# Patient Record
Sex: Female | Born: 1950
Health system: Southern US, Community
[De-identification: ages and names within clinical notes are randomized; demographics above are authoritative.]

## PROBLEM LIST (undated history)

## (undated) DIAGNOSIS — M1990 Unspecified osteoarthritis, unspecified site: Secondary | ICD-10-CM

## (undated) DIAGNOSIS — G4700 Insomnia, unspecified: Secondary | ICD-10-CM

## (undated) DIAGNOSIS — Z87442 Personal history of urinary calculi: Secondary | ICD-10-CM

## (undated) DIAGNOSIS — F329 Major depressive disorder, single episode, unspecified: Secondary | ICD-10-CM

## (undated) DIAGNOSIS — R42 Dizziness and giddiness: Secondary | ICD-10-CM

## (undated) DIAGNOSIS — R350 Frequency of micturition: Secondary | ICD-10-CM

## (undated) DIAGNOSIS — R091 Pleurisy: Secondary | ICD-10-CM

## (undated) DIAGNOSIS — Z973 Presence of spectacles and contact lenses: Secondary | ICD-10-CM

## (undated) DIAGNOSIS — N151 Renal and perinephric abscess: Secondary | ICD-10-CM

## (undated) DIAGNOSIS — F32A Depression, unspecified: Secondary | ICD-10-CM

## (undated) DIAGNOSIS — A419 Sepsis, unspecified organism: Secondary | ICD-10-CM

## (undated) HISTORY — PX: CATARACT EXTRACTION: SUR2

## (undated) HISTORY — DX: Depression, unspecified: F32.A

## (undated) HISTORY — DX: Renal and perinephric abscess: N15.1

## (undated) HISTORY — PX: CEREBRAL ANEURYSM REPAIR: SHX164

## (undated) HISTORY — DX: Insomnia, unspecified: G47.00

## (undated) HISTORY — DX: Dizziness and giddiness: R42

## (undated) HISTORY — PX: TUBAL LIGATION: SHX77

---
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
## 2010-01-19 DIAGNOSIS — N151 Renal and perinephric abscess: Secondary | ICD-10-CM

## 2010-01-19 HISTORY — DX: Renal and perinephric abscess: N15.1

## 2014-08-28 ENCOUNTER — Other Ambulatory Visit: Payer: Self-pay | Admitting: Nurse Practitioner

## 2014-08-28 DIAGNOSIS — Z1231 Encounter for screening mammogram for malignant neoplasm of breast: Secondary | ICD-10-CM

## 2014-09-11 ENCOUNTER — Ambulatory Visit: Payer: Self-pay

## 2015-09-27 ENCOUNTER — Other Ambulatory Visit: Payer: Self-pay | Admitting: Nurse Practitioner

## 2015-09-27 DIAGNOSIS — Z8249 Family history of ischemic heart disease and other diseases of the circulatory system: Secondary | ICD-10-CM

## 2015-09-27 DIAGNOSIS — Z8679 Personal history of other diseases of the circulatory system: Secondary | ICD-10-CM

## 2015-09-27 DIAGNOSIS — H538 Other visual disturbances: Secondary | ICD-10-CM

## 2016-08-10 ENCOUNTER — Encounter (INDEPENDENT_AMBULATORY_CARE_PROVIDER_SITE_OTHER): Payer: Self-pay | Admitting: Ophthalmology

## 2016-08-12 ENCOUNTER — Encounter (INDEPENDENT_AMBULATORY_CARE_PROVIDER_SITE_OTHER): Payer: BLUE CROSS/BLUE SHIELD | Admitting: Ophthalmology

## 2016-08-12 DIAGNOSIS — H35341 Macular cyst, hole, or pseudohole, right eye: Secondary | ICD-10-CM

## 2016-08-12 DIAGNOSIS — H35372 Puckering of macula, left eye: Secondary | ICD-10-CM | POA: Diagnosis not present

## 2016-08-12 DIAGNOSIS — H43813 Vitreous degeneration, bilateral: Secondary | ICD-10-CM | POA: Diagnosis not present

## 2016-12-31 DIAGNOSIS — F331 Major depressive disorder, recurrent, moderate: Secondary | ICD-10-CM | POA: Insufficient documentation

## 2018-04-15 DIAGNOSIS — R05 Cough: Secondary | ICD-10-CM | POA: Diagnosis not present

## 2018-04-15 DIAGNOSIS — R1111 Vomiting without nausea: Secondary | ICD-10-CM | POA: Diagnosis not present

## 2018-07-13 DIAGNOSIS — H11823 Conjunctivochalasis, bilateral: Secondary | ICD-10-CM | POA: Diagnosis not present

## 2018-07-13 DIAGNOSIS — H11153 Pinguecula, bilateral: Secondary | ICD-10-CM | POA: Diagnosis not present

## 2018-07-13 DIAGNOSIS — H02834 Dermatochalasis of left upper eyelid: Secondary | ICD-10-CM | POA: Diagnosis not present

## 2018-07-13 DIAGNOSIS — H40013 Open angle with borderline findings, low risk, bilateral: Secondary | ICD-10-CM | POA: Diagnosis not present

## 2018-07-13 DIAGNOSIS — H0102B Squamous blepharitis left eye, upper and lower eyelids: Secondary | ICD-10-CM | POA: Diagnosis not present

## 2018-07-13 DIAGNOSIS — H468 Other optic neuritis: Secondary | ICD-10-CM | POA: Diagnosis not present

## 2018-07-13 DIAGNOSIS — H18413 Arcus senilis, bilateral: Secondary | ICD-10-CM | POA: Diagnosis not present

## 2018-07-13 DIAGNOSIS — H0102A Squamous blepharitis right eye, upper and lower eyelids: Secondary | ICD-10-CM | POA: Diagnosis not present

## 2018-07-13 DIAGNOSIS — H02831 Dermatochalasis of right upper eyelid: Secondary | ICD-10-CM | POA: Diagnosis not present

## 2018-07-18 DIAGNOSIS — R9082 White matter disease, unspecified: Secondary | ICD-10-CM | POA: Diagnosis not present

## 2018-07-18 DIAGNOSIS — H469 Unspecified optic neuritis: Secondary | ICD-10-CM | POA: Diagnosis not present

## 2018-07-18 DIAGNOSIS — H18413 Arcus senilis, bilateral: Secondary | ICD-10-CM | POA: Diagnosis not present

## 2018-08-03 DIAGNOSIS — G4700 Insomnia, unspecified: Secondary | ICD-10-CM | POA: Diagnosis not present

## 2018-08-03 DIAGNOSIS — R42 Dizziness and giddiness: Secondary | ICD-10-CM | POA: Diagnosis not present

## 2018-08-03 DIAGNOSIS — R05 Cough: Secondary | ICD-10-CM | POA: Diagnosis not present

## 2018-08-03 DIAGNOSIS — F329 Major depressive disorder, single episode, unspecified: Secondary | ICD-10-CM | POA: Diagnosis not present

## 2018-08-15 ENCOUNTER — Telehealth: Payer: Self-pay | Admitting: Neurology

## 2018-08-15 ENCOUNTER — Ambulatory Visit (INDEPENDENT_AMBULATORY_CARE_PROVIDER_SITE_OTHER): Payer: Medicare HMO | Admitting: Neurology

## 2018-08-15 ENCOUNTER — Encounter: Payer: Self-pay | Admitting: Neurology

## 2018-08-15 ENCOUNTER — Other Ambulatory Visit: Payer: Self-pay

## 2018-08-15 VITALS — BP 150/78 | HR 60 | Ht 63.0 in | Wt 186.0 lb

## 2018-08-15 DIAGNOSIS — G4719 Other hypersomnia: Secondary | ICD-10-CM | POA: Diagnosis not present

## 2018-08-15 DIAGNOSIS — R0683 Snoring: Secondary | ICD-10-CM

## 2018-08-15 DIAGNOSIS — Z9889 Other specified postprocedural states: Secondary | ICD-10-CM | POA: Diagnosis not present

## 2018-08-15 DIAGNOSIS — R42 Dizziness and giddiness: Secondary | ICD-10-CM | POA: Diagnosis not present

## 2018-08-15 DIAGNOSIS — R351 Nocturia: Secondary | ICD-10-CM

## 2018-08-15 DIAGNOSIS — I671 Cerebral aneurysm, nonruptured: Secondary | ICD-10-CM | POA: Diagnosis not present

## 2018-08-15 DIAGNOSIS — H81399 Other peripheral vertigo, unspecified ear: Secondary | ICD-10-CM | POA: Diagnosis not present

## 2018-08-15 NOTE — Patient Instructions (Signed)
Unfortunately, dizziness and vertigo are common complaints but often not due to a primary neurological reason or single underlying medical problem. Often, there a combination of factors, that result in dizziness. This includes blood pressure fluctuations, medication side effects, blood sugar fluctuations, stress, vertigo episodes, poor sleep with sleep deprivation, dehydration, and electrolyte disturbance or other metabolic and endocrinological reasons, meaning hormone related problems such as thyroid dysfunction. Your recent brain MRI was stable. We will request a MRA (angiogram). I will refer you to physical therapy for Vestibular rehab.  We will also pursue a sleep study to rule out obstructive sleep apnea.

## 2018-08-15 NOTE — Telephone Encounter (Signed)
Mcarthur Rossetti Josem Kaufmann: [id number] (exp. 08/15/18 to 09/14/18) faxed order to [facility] they will reach out to the patient to schedule. Their phone number [phone] & fax # [phone]

## 2018-08-15 NOTE — Progress Notes (Signed)
Subjective:    Patient ID: Karla Watson is a 68 y.o. female.  HPI     Huston FoleySaima Lekendrick Alpern, MD, PhD [facility] [address], [address] [address] [place], [place] [address]  Dear Dr. Kateri PlummerMorrow,   I saw your patient, Raeanne GathersLori Scronce, upon your kind request in my neurologic clinic today for initial consultation of her recurrent vertigo.  The patient is unaccompanied today.  As you know, Ms. Doug Souoensing is a 68 year old right-handed woman with an underlying medical history of depression, history of kidney abscess, history of cerebral aneurysm with status post coiling in 2015, status post cataract surgery, depression, insomnia, and obesity, who reports recurrent vertigo spells for the past several months.  They are stable.  Symptoms occur with rapid changes in body position or head turns.  She has not fallen thankfully.  She had similar symptoms but to a stronger degree however when she was diagnosed with her brain aneurysm.  She worries a little bit about the brain aneurysm.  She had coiling some 15 years ago.  She has had symptoms when standing up from the seated position or while walking.  She declined orthostatic blood pressure checks today stating that the blood pressure cuff hurts when it is done repeatedly.  She was given exercises for vertigo and found them not particularly helpful.  She has not been sleeping well.  This is a chronic and ongoing problem, she snores, she has nocturia about 2 or 3 times per average night and has had the occasional morning headaches in the past month and a half.  She had recurrent ear infections as a child.  Her Epworth sleepiness score is 11 out of 24.  She drinks caffeine in the form of coffee, about 2 cups/day, soda 1 to 2/day and occasional tea.  She drinks alcohol occasionally.  She lives with her husband. I reviewed your office note from 08/03/2018.  Of note, she is on psychotropic medications including trazodone 100 mg at bedtime and fluoxetine 40 mg  daily, she takes bupropion long-acting 300 mg daily as well.  She reported intermittent vertigo spells over the past 3 weeks at the time.  She is followed by [facility] and had recent MRIs.  She had a brain MRI and orbital MRI with and without contrast on 07/18/2018 through [facility] and I reviewed the results: IMPRESSION: No acute findings or mass lesion. Suspected mild decreased size/atrophy of the left optic nerve. Please correlate with clinical exam findings. No findings of acute optic neuritis or other inflammatory/infectious process. No compressive pathology  identified of the optic nerves or optic chiasm. IMPRESSION: 1. No acute intracranial process. Stable brain MRI compared to 10/08/2015. Mild white matter disease, nonspecific but commonly seen with chronic small vessel ischemic change particularly in patients of this age. No significant progression. No enhancing white matter lesion.  2. Contemporaneous orbital MRI is reported separately. As I understand. She had a brain MRI with and without contrast through [facility] on 10/09/2015 and I reviewed the results:  IMPRESSION:  Mild chronic microvascular ischemic change. Otherwise normal MRI of the brain. No acute abnormality.  Her Past Medical History Is Significant For: Past Medical History:  Diagnosis Date  . Depression   . Insomnia   . Kidney abscess   . Vertigo     Her Past Surgical History Is Significant For: Past Surgical History:  Procedure Laterality Date  . CATARACT EXTRACTION    . CEREBRAL ANEURYSM REPAIR    . TUBAL LIGATION  Her Family History Is Significant For: Family History  Problem Relation Age of Onset  . Breast cancer Mother   . Prostate cancer Father     Her Social History Is Significant For: Social History   Socioeconomic History  . Marital status: Married    Spouse name: Not on file  . Number of children: Not on file  . Years of education: Not on file  . Highest education level: Not on  file  Occupational History  . Not on file  Social Needs  . Financial resource strain: Not on file  . Food insecurity    Worry: Not on file    Inability: Not on file  . Transportation needs    Medical: Not on file    Non-medical: Not on file  Tobacco Use  . Smoking status: Never Smoker  . Smokeless tobacco: Never Used  Substance and Sexual Activity  . Alcohol use: Not on file  . Drug use: Not on file  . Sexual activity: Not on file  Lifestyle  . Physical activity    Days per week: Not on file    Minutes per session: Not on file  . Stress: Not on file  Relationships  . Social Herbalist on phone: Not on file    Gets together: Not on file    Attends religious service: Not on file    Active member of club or organization: Not on file    Attends meetings of clubs or organizations: Not on file    Relationship status: Not on file  Other Topics Concern  . Not on file  Social History Narrative  . Not on file    Her Allergies Are:  Allergies  Allergen Reactions  . Erythromycin   :   Her Current Medications Are:  Outpatient Encounter Medications as of 08/15/2018  Medication Sig  . buPROPion (WELLBUTRIN XL) 300 MG 24 hr tablet Take 300 mg by mouth daily.  Marland Kitchen FLUoxetine (PROZAC) 40 MG capsule Take 40 mg by mouth daily.  . traZODone (DESYREL) 100 MG tablet Take 100 mg by mouth at bedtime.   No facility-administered encounter medications on file as of 08/15/2018.   :   Review of Systems:  Out of a complete 14 point review of systems, all are reviewed and negative with the exception of these symptoms as listed below:  Review of Systems  Neurological:       Pt presents today to discuss her vertigo. She has had persistent stable vertigo for 6 months. She has declined orthostatic blood pressures.  Epworth Sleepiness Scale 0= would never doze 1= slight chance of dozing 2= moderate chance of dozing 3= high chance of dozing  Sitting and reading: 1 Watching TV:  2 Sitting inactive in a public place (ex. Theater or meeting): 1 As a passenger in a car for an hour without a break: 3 Lying down to rest in the afternoon: 3 Sitting and talking to someone: 0 Sitting quietly after lunch (no alcohol): 1 In a car, while stopped in traffic: 0 Total: 11     Objective:  Neurological Exam  Physical Exam Physical Examination:   Vitals:   08/15/18 1133  BP: (!) 150/78  Pulse: 60    General Examination: The patient is a very pleasant 68 y.o. female in no acute distress. She appears well-developed and well-nourished and well groomed. Declined orthostatics.  HEENT: Normocephalic, atraumatic, pupils are equal, round and reactive to light and accommodation. Corrective eyeglasses in place,  hearing is slightly impaired appearing.  Tympanic membranes are clear bilaterally.  Face is symmetric.  No vertigo spells with changes in her head or body position currently.  She has no hypophonia, no lip, neck or jaw tremor, neck is supple, no carotid bruits, airway examination reveals a longer uvula, small airway, small tonsils, neck circumference of 14-7/8 inches. Tongue protrudes centrally and palate elevates symmetrically.   Chest: Clear to auscultation without wheezing, rhonchi or crackles noted.  Heart: S1+S2+0, regular and normal without murmurs, rubs or gallops noted.   Abdomen: Soft, non-tender and non-distended with normal bowel sounds appreciated on auscultation.  Extremities: There is no pitting edema in the distal lower extremities bilaterally. Pedal pulses are intact.  Skin: Warm and dry without trophic changes noted. There are no significant varicose veins.  Musculoskeletal: exam reveals no obvious joint deformities, tenderness or joint swelling or erythema.   Neurologically:  Mental status: The patient is awake, alert and oriented in all 4 spheres. Her immediate and remote memory, attention, language skills and fund of knowledge are appropriate. There  is no evidence of aphasia, agnosia, apraxia or anomia. Speech is clear with normal prosody and enunciation. Thought process is linear. Mood is normal and affect is normal.  Cranial nerves II - XII are as described above under HEENT exam. In addition: shoulder shrug is normal with equal shoulder height noted. Motor exam: Normal bulk, strength and tone is noted. There is no drift, tremor or rebound. Romberg is not tested, she does not feel Comfortable standing with her eyes closed, reflexes are 2+ throughout. Babinski: Toes are flexor bilaterally. Fine motor skills and coordination: intact with normal finger taps, normal hand movements, normal rapid alternating patting, normal foot taps and normal foot agility.  Cerebellar testing: No dysmetria or intention tremor on finger to nose testing. Heel to shin is unremarkable bilaterally. There is no truncal or gait ataxia.  Sensory exam: intact to light touch, vibration, temperature sense in the upper and lower extremities.  Gait, station and balance: She stands easily. No veering to one side is noted. No leaning to one side is noted. Posture is age-appropriate and stance is narrow based. Gait shows normal stride length and normal pace. No problems turning are noted. Tandem walk is slow and cautious, but doable.   Assessment and Plan:   In summary, Karla EvensLaurie Bumgarner is a very pleasant 68 y.o.-year old female with an underlying medical history of depression, history of kidney abscess, history of cerebral aneurysm with status post coiling in 2015, status post cataract surgery, depression, insomnia, and obesity, who Presents for evaluation of her recurrent vertiginous spells.  Her history and examination are somewhat concerning for episodic vertigo.  She has a history of aneurysm coiling 15 years ago.  She worries about recurrence of symptoms.  She is advised that her recent brain MRI was stable.  We can proceed with an MRA head for more reassurance.  She has not had  any physical therapy for vertigo.  I suggested we make a referral to PT.  She is agreeable.  Furthermore, I suggested we proceed with a sleep study to rule out Underlying obstructive sleep apnea.  Sleep deprivation can certainly cause dizziness and make vertigo worse, we have seen that connection from time to time.  She does report snoring and not sleeping well, nocturia as well as recent morning headaches.  She may benefit from evaluating her blood pressure numbers with orthostatic vital signs, she declined testing these today in our office.  She is encouraged to talk to about this when she follows up with you.  For now, we will proceed with a referral to neuro rehab for vestibular therapy through physical therapy.  We will proceed with a sleep study and a MRA head without contrast, we will request for the MR angiogram to be done at the same location she had her most recent MRIs.  We will keep her posted as to her test results and follow-up after testing.  I answered all her questions today and she was in agreement  Thank you very much for allowing me to participate in the care of Ms. Toesing. If I can be of any further assistance to you please do not hesitate to call me at [phone].  Sincerely,   Huston FoleySaima Tranice Laduke, MD, PhD

## 2018-08-16 ENCOUNTER — Telehealth: Payer: Self-pay

## 2018-08-16 DIAGNOSIS — R0683 Snoring: Secondary | ICD-10-CM

## 2018-08-16 NOTE — Telephone Encounter (Signed)
[employer] denied in lab sleep study. Does not meet criteria. Need HST order

## 2018-08-16 NOTE — Telephone Encounter (Signed)
VO for HST from Dr. Athar received. HST order placed.  

## 2018-08-24 ENCOUNTER — Ambulatory Visit
Admission: RE | Admit: 2018-08-24 | Discharge: 2018-08-24 | Disposition: A | Discharge status: Home / Self Care | Payer: Medicare HMO | Source: Ambulatory Visit | Attending: Family Medicine | Admitting: Family Medicine

## 2018-08-24 ENCOUNTER — Other Ambulatory Visit: Payer: Self-pay | Admitting: Family Medicine

## 2018-08-24 DIAGNOSIS — R05 Cough: Secondary | ICD-10-CM

## 2018-08-24 DIAGNOSIS — R059 Cough, unspecified: Secondary | ICD-10-CM

## 2018-09-05 ENCOUNTER — Ambulatory Visit: Payer: Medicare HMO | Attending: Neurology | Admitting: Physical Therapy

## 2018-09-05 ENCOUNTER — Telehealth: Payer: Self-pay

## 2018-09-05 NOTE — Telephone Encounter (Signed)
We have attempted to call the patient two times to schedule sleep study.  Patient has been unavailable at the phone numbers we have on file and has not returned our calls. If patient calls back we will schedule them for their sleep study.  

## 2018-12-20 DIAGNOSIS — R091 Pleurisy: Secondary | ICD-10-CM

## 2018-12-20 HISTORY — DX: Pleurisy: R09.1

## 2018-12-22 ENCOUNTER — Other Ambulatory Visit: Payer: Self-pay

## 2018-12-22 ENCOUNTER — Emergency Department (HOSPITAL_COMMUNITY): Payer: Medicare HMO

## 2018-12-22 ENCOUNTER — Encounter (HOSPITAL_COMMUNITY): Payer: Self-pay | Admitting: Emergency Medicine

## 2018-12-22 ENCOUNTER — Emergency Department (HOSPITAL_COMMUNITY)
Admission: EM | Admit: 2018-12-22 | Discharge: 2018-12-22 | Disposition: A | Discharge status: Home / Self Care | Payer: Medicare HMO | Attending: Emergency Medicine | Admitting: Emergency Medicine

## 2018-12-22 DIAGNOSIS — Z79899 Other long term (current) drug therapy: Secondary | ICD-10-CM | POA: Insufficient documentation

## 2018-12-22 DIAGNOSIS — R0602 Shortness of breath: Secondary | ICD-10-CM | POA: Diagnosis present

## 2018-12-22 DIAGNOSIS — N10 Acute tubulo-interstitial nephritis: Secondary | ICD-10-CM | POA: Insufficient documentation

## 2018-12-22 DIAGNOSIS — R091 Pleurisy: Secondary | ICD-10-CM | POA: Diagnosis not present

## 2018-12-22 DIAGNOSIS — R1110 Vomiting, unspecified: Secondary | ICD-10-CM | POA: Insufficient documentation

## 2018-12-22 DIAGNOSIS — R079 Chest pain, unspecified: Secondary | ICD-10-CM | POA: Diagnosis not present

## 2018-12-22 DIAGNOSIS — R0789 Other chest pain: Secondary | ICD-10-CM | POA: Diagnosis not present

## 2018-12-22 HISTORY — DX: Pleurisy: R09.1

## 2018-12-22 LAB — BASIC METABOLIC PANEL
Anion gap: 9 (ref 5–15)
BUN: 10 mg/dL (ref 8–23)
CO2: 27 mmol/L (ref 22–32)
Calcium: 9.6 mg/dL (ref 8.9–10.3)
Chloride: 102 mmol/L (ref 98–111)
Creatinine, Ser: 0.98 mg/dL (ref 0.44–1.00)
GFR calc Af Amer: >60 mL/min (ref >60)
GFR calc non Af Amer: >60 mL/min (ref >60)
Glucose, Bld: 84 mg/dL (ref 70–99)
Potassium: 4.1 mmol/L (ref 3.5–5.1)
Sodium: 138 mmol/L (ref 135–145)

## 2018-12-22 LAB — CBC
HCT: 44.5 % (ref 36.0–46.0)
Hemoglobin: 14.8 g/dL (ref 12.0–15.0)
MCH: 28.6 pg (ref 26.0–34.0)
MCHC: 33.3 g/dL (ref 30.0–36.0)
MCV: 86.1 fL (ref 80.0–100.0)
Platelets: 232 10*3/uL (ref 150–400)
RBC: 5.17 MIL/uL — ABNORMAL HIGH (ref 3.87–5.11)
RDW: 12 % (ref 11.5–15.5)
WBC: 6.2 10*3/uL (ref 4.0–10.5)
nRBC: 0 % (ref 0.0–0.2)

## 2018-12-22 LAB — LIPASE, BLOOD: Lipase: 52 U/L — ABNORMAL HIGH (ref 11–51)

## 2018-12-22 LAB — URINALYSIS, ROUTINE W REFLEX MICROSCOPIC
Bilirubin Urine: NEGATIVE
Glucose, UA: NEGATIVE mg/dL
Hgb urine dipstick: NEGATIVE
Ketones, ur: 5 mg/dL — AB
Nitrite: POSITIVE — AB
Protein, ur: 30 mg/dL — AB
Specific Gravity, Urine: 1.021 (ref 1.005–1.030)
WBC, UA: >50 WBC/hpf — ABNORMAL HIGH (ref 0–5)
pH: 5 (ref 5.0–8.0)

## 2018-12-22 LAB — HEPATIC FUNCTION PANEL
ALT: 19 U/L (ref 0–44)
AST: 19 U/L (ref 15–41)
Albumin: 4 g/dL (ref 3.5–5.0)
Alkaline Phosphatase: 58 U/L (ref 38–126)
Bilirubin, Direct: 0.1 mg/dL (ref 0.0–0.2)
Indirect Bilirubin: 0.7 mg/dL (ref 0.3–0.9)
Total Bilirubin: 0.8 mg/dL (ref 0.3–1.2)
Total Protein: 6.9 g/dL (ref 6.5–8.1)

## 2018-12-22 LAB — TROPONIN I (HIGH SENSITIVITY)
Troponin I (High Sensitivity): 5 ng/L (ref <18)
Troponin I (High Sensitivity): 5 ng/L (ref <18)

## 2018-12-22 LAB — D-DIMER, QUANTITATIVE: D-Dimer, Quant: 0.48 ug/mL-FEU (ref 0.00–0.50)

## 2018-12-22 MED ORDER — SODIUM CHLORIDE 0.9 % IV SOLN
1.0000 g | Freq: Once | INTRAVENOUS | Status: AC
  Administered 2018-12-22: 18:00:00 1 g via INTRAVENOUS
  Filled 2018-12-22: qty 10

## 2018-12-22 MED ORDER — ONDANSETRON HCL 4 MG/2ML IJ SOLN
4.0000 mg | Freq: Once | INTRAMUSCULAR | Status: AC
  Administered 2018-12-22: 16:00:00 4 mg via INTRAVENOUS
  Filled 2018-12-22: qty 2

## 2018-12-22 MED ORDER — SODIUM CHLORIDE 0.9% FLUSH: 3.0000 mL | Freq: Once | INTRAVENOUS | Status: DC

## 2018-12-22 MED ORDER — KETOROLAC TROMETHAMINE 15 MG/ML IJ SOLN
15.0000 mg | Freq: Once | INTRAMUSCULAR | Status: AC
  Administered 2018-12-22: 15 mg via INTRAVENOUS
  Filled 2018-12-22: qty 1

## 2018-12-22 MED ORDER — HYDROCODONE-ACETAMINOPHEN 5-325 MG PO TABS: 1.0000 | ORAL_TABLET | Freq: Four times a day (QID) | ORAL | 0 refills | Status: DC | PRN

## 2018-12-22 MED ORDER — MORPHINE SULFATE (PF) 4 MG/ML IV SOLN
4.0000 mg | Freq: Once | INTRAVENOUS | Status: AC
  Administered 2018-12-22: 16:00:00 4 mg via INTRAVENOUS
  Filled 2018-12-22: qty 1

## 2018-12-22 MED ORDER — LIDOCAINE 5 % EX PTCH: 1.0000 | MEDICATED_PATCH | CUTANEOUS | 0 refills | Status: DC

## 2018-12-22 MED ORDER — CEPHALEXIN 500 MG PO CAPS: 500.0000 mg | ORAL_CAPSULE | Freq: Four times a day (QID) | ORAL | 0 refills | Status: DC

## 2018-12-22 MED ORDER — ONDANSETRON 4 MG PO TBDP: 4.0000 mg | ORAL_TABLET | Freq: Three times a day (TID) | ORAL | 0 refills | Status: DC | PRN

## 2018-12-22 NOTE — ED Notes (Signed)
ED Provider at bedside. 

## 2018-12-22 NOTE — ED Triage Notes (Signed)
C/o L sided back pain with SOB, vomiting, and dizziness since yesterday.  States it feels like her L lower lung and she believes it is pleurisy because it feels the same as the last time she had it.

## 2018-12-22 NOTE — ED Provider Notes (Signed)
[facility] EMERGENCY DEPARTMENT Provider Note   CSN: [id number] Arrival date & time: 12/22/18  1356     History   Chief Complaint Chief Complaint  Patient presents with   back pain   Emesis   Shortness of Breath    HPI Karla Watson is a 68 y.o. female.     Patient is a 68 year old female with a history of depression, pleurisy, vertigo and prior kidney abscess who is presenting today with 2-day history of worsening left side pain.  She describes it as a sharp stabbing severe pain that is worse with taking a deep breath, coughing or any type of movement.  It started spontaneously without any known exacerbating factor.  She denies any falls, jerking or twisting motions prior to the event.  She has not had cough and does not have specific shortness of breath but states with any type of deep breathing she catches her breath because the pain is so severe.  In the last 2 days she has had multiple episodes of emesis which she thinks is related to the pain.  She has no localized abdominal pain, urinary complaints or change in bowel movements.  She denies fever, congestion, sore throat.  No known Covid contacts.  No family history of blood clots or heart disease.  She herself has never had heart problems or clots.  She has had no recent travel and denies any unilateral leg pain or swelling.  No smoking history or lung disease.  The history is provided by the patient.  Emesis Shortness of Breath Associated symptoms: vomiting     Past Medical History:  Diagnosis Date   Depression    Insomnia    Kidney abscess    Pleurisy    Vertigo     There are no active problems to display for this patient.   Past Surgical History:  Procedure Laterality Date   CATARACT EXTRACTION     CEREBRAL ANEURYSM REPAIR     TUBAL LIGATION       OB History   No obstetric history on file.      Home Medications    Prior to Admission medications   Medication Sig Start  Date End Date Taking? Authorizing Provider  buPROPion (WELLBUTRIN XL) 300 MG 24 hr tablet Take 300 mg by mouth daily.    [provider]  FLUoxetine (PROZAC) 40 MG capsule Take 40 mg by mouth daily.    [provider]  traZODone (DESYREL) 100 MG tablet Take 100 mg by mouth at bedtime.    [provider]    Family History Family History  Problem Relation Age of Onset   Breast cancer Mother    Prostate cancer Father     Social History Social History   Tobacco Use   Smoking status: Never Smoker   Smokeless tobacco: Never Used  Substance Use Topics   Alcohol use: Not Currently   Drug use: Never     Allergies   Erythromycin   Review of Systems Review of Systems  Respiratory: Positive for shortness of breath.   Gastrointestinal: Positive for vomiting.  All other systems reviewed and are negative.    Physical Exam Updated Vital Signs BP (!) 168/72 (BP Location: Right Arm)    Pulse 61    Temp 98.8 F (37.1 C) (Oral)    Resp 20    SpO2 97%   Physical Exam Vitals signs and nursing note reviewed.  Constitutional:      General: She is  not in acute distress.    Appearance: She is well-developed. She is obese.  HENT:     Head: Normocephalic and atraumatic.     Mouth/Throat:     Mouth: Mucous membranes are moist.  Eyes:     Pupils: Pupils are equal, round, and reactive to light.  Cardiovascular:     Rate and Rhythm: Normal rate and regular rhythm.     Heart sounds: Normal heart sounds. No murmur. No friction rub.  Pulmonary:     Effort: Pulmonary effort is normal. Tachypnea present.     Breath sounds: Normal breath sounds. No wheezing or rales.  Chest:     Chest wall: Tenderness present.    Abdominal:     General: Bowel sounds are normal. There is no distension.     Palpations: Abdomen is soft.     Tenderness: There is no abdominal tenderness. There is no right CVA tenderness, left CVA tenderness, guarding or rebound.    Musculoskeletal: Normal range of motion.        General: No tenderness.     Right lower leg: No edema.     Left lower leg: No edema.     Comments: No edema  Skin:    General: Skin is warm and dry.     Capillary Refill: Capillary refill takes less than 2 seconds.     Findings: Rash present.       Neurological:     General: No focal deficit present.     Mental Status: She is alert and oriented to person, place, and time. Mental status is at baseline.     Cranial Nerves: No cranial nerve deficit.  Psychiatric:        Mood and Affect: Mood normal.        Behavior: Behavior normal.        Thought Content: Thought content normal.      ED Treatments / Results  Labs (all labs ordered are listed, but only abnormal results are displayed) Labs Reviewed  CBC - Abnormal; Notable for the following components:      Result Value   RBC 5.17 (*)    All other components within normal limits  URINALYSIS, ROUTINE W REFLEX MICROSCOPIC - Abnormal; Notable for the following components:   APPearance CLOUDY (*)    Ketones, ur 5 (*)    Protein, ur 30 (*)    Nitrite POSITIVE (*)    Leukocytes,Ua LARGE (*)    WBC, UA >50 (*)    Bacteria, UA MANY (*)    Non Squamous Epithelial 0-5 (*)    All other components within normal limits  LIPASE, BLOOD - Abnormal; Notable for the following components:   Lipase 52 (*)    All other components within normal limits  URINE CULTURE  BASIC METABOLIC PANEL  D-DIMER, QUANTITATIVE (NOT AT [facility])  HEPATIC FUNCTION PANEL  TROPONIN I (HIGH SENSITIVITY)  TROPONIN I (HIGH SENSITIVITY)    EKG EKG Interpretation  Date/Time:  Thursday December 22 2018 14:46:41 EST Ventricular Rate:  62 PR Interval:  108 QRS Duration: 80 QT Interval:  408 QTC Calculation: 414 R Axis:   62 Text Interpretation: Sinus rhythm with short PR Otherwise normal ECG No previous tracing Confirmed by Gwyneth SproutPlunkett, Johnelle Tafolla ([id number]) on 12/22/2018 3:22:12 PM   Radiology Dg Chest 2  View  Result Date: 12/22/2018 CLINICAL DATA:  Left-sided posterior chest pain since yesterday. Pleuritic component. EXAM: CHEST - 2 VIEW COMPARISON:  08/24/2018 FINDINGS: Lungs are adequately inflated and otherwise clear.  Cardiomediastinal silhouette and remainder the exam is unchanged. IMPRESSION: No active cardiopulmonary disease. Electronically Signed   By: Marin Olp M.D.   On: 12/22/2018 15:25    Procedures Procedures (including critical care time)  Medications Ordered in ED Medications  sodium chloride flush (NS) 0.9 % injection 3 mL (has no administration in time range)  morphine 4 MG/ML injection 4 mg (has no administration in time range)  ondansetron (ZOFRAN) injection 4 mg (has no administration in time range)     Initial Impression / Assessment and Plan / ED Course  I have reviewed the triage vital signs and the nursing notes.  Pertinent labs & imaging results that were available during my care of the patient were reviewed by me and considered in my medical decision making (see chart for details).        68y/o female presenting with atypical chest pain today which is more pleuritic in nature and reproducible with palpation and movement.  There is no sign of shingles today.  Patient denies trauma.  Patient does have a history of pleurisy.  Lung sounds are clear bilaterally and x-ray without acute findings.  Patient does not have symptoms consistent with Covid.  She is a low risk Wells for age but has no unilateral leg pain or swelling or other risk factors.  Patient given pain control.  She has no abdominal pain today concerning for pancreatitis, hepatitis, diverticulitis or appendicitis.  Lower suspicion for pyelonephritis.  However patient has had a kidney abscess in the past so we will check a UA Patient is satting 97% on room air and low suspicion for ACS, pericarditis, myocarditis or dissection.  EKG without acute findings.  4:35 PM Labs are all fairly unremarkable.   Troponin after 2 days of constant pain is 5 and low suspicion for ACS.  D-dimer is within normal limits.  LFTs, renal function lipase all within normal limits.  Patient's pain is improved after morphine but will also give Toradol as there is no contraindication.  We will send patient home with supportive care and follow-up with PCP in approximately 1 week if symptoms persist.  Patient given strict return precautions.  5:26 PM Patient is feeling better after Toradol.  Urine did come back and appears infected with greater than 50 white cells, many bacteria and positive leukocytes and nitrites.  Patient does have a history of incontinence and does have to wear a pad regularly but denies any dysuria.  However with the vomiting the side pain will cover for possible early pyelonephritis.  Patient received 1 dose of Rocephin here and sent home with Keflex.  She is comfortable with the plan of care.  Final Clinical Impressions(s) / ED Diagnoses   Final diagnoses:  Pleurisy without effusion  Left-sided chest wall pain  Acute pyelonephritis    ED Discharge Orders         Ordered    cephALEXin (KEFLEX) 500 MG capsule  4 times daily     12/22/18 1730    ondansetron (ZOFRAN ODT) 4 MG disintegrating tablet  Every 8 hours PRN     12/22/18 1730    lidocaine (LIDODERM) 5 %  Every 24 hours     12/22/18 1730    HYDROcodone-acetaminophen (NORCO/VICODIN) 5-325 MG tablet  Every 6 hours PRN     12/22/18 1730           Blanchie Dessert, MD 12/22/18 1731

## 2018-12-22 NOTE — Discharge Instructions (Addendum)
Today your x-ray and blood tests were all normal.  No signs of heart attack, pneumonia or blood clot.  This is most likely pleurisy however  your urine was infected and we are treating you  for possible kidney infection with antibiotics. If you start running fever, become more short of breath or you are not able to hold anything down please return to the emergency room.  Use the lidocaine patches and take aleve for pain.  If the pain is not controlled with those medications you can also take the hydrocodone.  Also take the medications as needed for nausea.

## 2018-12-25 LAB — URINE CULTURE: Culture: >=100000 — AB

## 2018-12-26 ENCOUNTER — Telehealth: Payer: Self-pay

## 2018-12-26 DIAGNOSIS — R109 Unspecified abdominal pain: Secondary | ICD-10-CM | POA: Diagnosis not present

## 2018-12-26 DIAGNOSIS — R0781 Pleurodynia: Secondary | ICD-10-CM | POA: Diagnosis not present

## 2018-12-26 DIAGNOSIS — R748 Abnormal levels of other serum enzymes: Secondary | ICD-10-CM | POA: Diagnosis not present

## 2018-12-26 NOTE — Telephone Encounter (Signed)
Post ED Visit - Positive Culture Follow-up  Culture report reviewed by antimicrobial stewardship pharmacist: [place] Team []  Elenor Quinones, Pharm.D. []  Heide Guile, Pharm.D., BCPS AQ-ID []  Parks Neptune, Pharm.D., BCPS []  Alycia Rossetti, Pharm.D., BCPS []  [place], Pharm.D., BCPS, AAHIVP []  Legrand Como, Pharm.D., BCPS, AAHIVP []  Salome Arnt, PharmD, BCPS []  Johnnette Gourd, PharmD, BCPS []  Hughes Better, PharmD, BCPS []  Leeroy Cha, PharmD []  Laqueta Linden, PharmD, BCPS []  Albertina Parr, [place] Team []  Leodis Sias, PharmD []  Lindell Spar, PharmD []  Royetta Asal, PharmD []  Graylin Shiver, Rph []  Rema Fendt) Glennon Mac, PharmD []  Arlyn Dunning, PharmD []  Netta Cedars, PharmD []  Dia Sitter, PharmD []  Leone Haven, PharmD []  Gretta Arab, PharmD []  Theodis Shove, PharmD []  Peggyann Juba, PharmD []  Reuel Boom, PharmD   Positive urine culture Treated with Cephalexin, organism sensitive to the same and no further patient follow-up is required at this time.  Genia Del 12/26/2018, 11:05 AM

## 2019-01-05 DIAGNOSIS — R109 Unspecified abdominal pain: Secondary | ICD-10-CM | POA: Diagnosis not present

## 2019-01-05 DIAGNOSIS — R748 Abnormal levels of other serum enzymes: Secondary | ICD-10-CM | POA: Diagnosis not present

## 2019-02-23 ENCOUNTER — Other Ambulatory Visit: Payer: Self-pay | Admitting: Gastroenterology

## 2019-02-23 DIAGNOSIS — R748 Abnormal levels of other serum enzymes: Secondary | ICD-10-CM | POA: Diagnosis not present

## 2019-02-23 DIAGNOSIS — R12 Heartburn: Secondary | ICD-10-CM | POA: Diagnosis not present

## 2019-02-23 DIAGNOSIS — R1013 Epigastric pain: Secondary | ICD-10-CM

## 2019-03-05 ENCOUNTER — Ambulatory Visit: Payer: Medicare HMO | Attending: Internal Medicine

## 2019-03-05 DIAGNOSIS — Z23 Encounter for immunization: Secondary | ICD-10-CM

## 2019-03-05 NOTE — Progress Notes (Signed)
   Covid-19 Vaccination Clinic  Name:  Savonna Birchmeier    MRN: [id number] DOB: [birth year]  03/05/2019  Ms. Karczewski was observed post Covid-19 immunization for 15 minutes without incidence. She was provided with Vaccine Information Sheet and instruction to access the V-Safe system.   Ms. Hinnenkamp was instructed to call 911 with any severe reactions post vaccine: Marland Kitchen Difficulty breathing  . Swelling of your face and throat  . A fast heartbeat  . A bad rash all over your body  . Dizziness and weakness    Immunizations Administered    Name Date Dose VIS Date Route   Pfizer COVID-19 Vaccine 03/05/2019 11:15 AM 0.3 mL 12/30/2018 Intramuscular   Manufacturer: [employer], [employer]   Lot: [id number]   NDC: [address]-7

## 2019-03-06 ENCOUNTER — Ambulatory Visit
Admission: RE | Admit: 2019-03-06 | Discharge: 2019-03-06 | Disposition: A | Discharge status: Home / Self Care | Payer: Medicare HMO | Source: Ambulatory Visit | Attending: Gastroenterology | Admitting: Gastroenterology

## 2019-03-06 DIAGNOSIS — K7689 Other specified diseases of liver: Secondary | ICD-10-CM | POA: Diagnosis not present

## 2019-03-06 DIAGNOSIS — R748 Abnormal levels of other serum enzymes: Secondary | ICD-10-CM

## 2019-03-06 DIAGNOSIS — R1013 Epigastric pain: Secondary | ICD-10-CM

## 2019-03-14 ENCOUNTER — Other Ambulatory Visit: Payer: Self-pay | Admitting: Gastroenterology

## 2019-03-14 DIAGNOSIS — N289 Disorder of kidney and ureter, unspecified: Secondary | ICD-10-CM

## 2019-03-28 ENCOUNTER — Ambulatory Visit: Payer: Medicare HMO | Attending: Internal Medicine

## 2019-03-28 DIAGNOSIS — Z23 Encounter for immunization: Secondary | ICD-10-CM | POA: Insufficient documentation

## 2019-03-28 NOTE — Progress Notes (Signed)
   Covid-19 Vaccination Clinic  Name:  Karla Watson    MRN: [id number] DOB: 03/27/1950  03/28/2019  Karla Watson was observed post Covid-19 immunization for 15 minutes without incident. She was provided with Vaccine Information Sheet and instruction to access the V-Safe system.   Karla Watson was instructed to call 911 with any severe reactions post vaccine: Marland Kitchen Difficulty breathing  . Swelling of face and throat  . A fast heartbeat  . A bad rash all over body  . Dizziness and weakness   Immunizations Administered    Name Date Dose VIS Date Route   Pfizer COVID-19 Vaccine 03/28/2019  1:36 PM 0.3 mL 12/30/2018 Intramuscular   Manufacturer: [employer], [employer]   Lot: [id number]   NDC: [address]-3

## 2019-04-19 DIAGNOSIS — K3189 Other diseases of stomach and duodenum: Secondary | ICD-10-CM | POA: Diagnosis not present

## 2019-04-19 DIAGNOSIS — N289 Disorder of kidney and ureter, unspecified: Secondary | ICD-10-CM | POA: Diagnosis not present

## 2019-04-19 DIAGNOSIS — K760 Fatty (change of) liver, not elsewhere classified: Secondary | ICD-10-CM | POA: Diagnosis not present

## 2019-04-19 DIAGNOSIS — N281 Cyst of kidney, acquired: Secondary | ICD-10-CM | POA: Diagnosis not present

## 2019-04-19 DIAGNOSIS — K449 Diaphragmatic hernia without obstruction or gangrene: Secondary | ICD-10-CM | POA: Diagnosis not present

## 2019-09-26 ENCOUNTER — Other Ambulatory Visit: Payer: Self-pay | Admitting: Family Medicine

## 2019-09-26 DIAGNOSIS — Z1231 Encounter for screening mammogram for malignant neoplasm of breast: Secondary | ICD-10-CM

## 2019-10-17 ENCOUNTER — Ambulatory Visit: Payer: Medicare HMO

## 2020-03-05 DIAGNOSIS — R5383 Other fatigue: Secondary | ICD-10-CM | POA: Diagnosis not present

## 2020-03-05 DIAGNOSIS — R1110 Vomiting, unspecified: Secondary | ICD-10-CM | POA: Diagnosis not present

## 2020-03-06 DIAGNOSIS — R1110 Vomiting, unspecified: Secondary | ICD-10-CM | POA: Diagnosis not present

## 2020-03-06 DIAGNOSIS — R5383 Other fatigue: Secondary | ICD-10-CM | POA: Diagnosis not present

## 2020-03-07 ENCOUNTER — Inpatient Hospital Stay (HOSPITAL_COMMUNITY)
Admission: EM | Admit: 2020-03-07 | Discharge: 2020-03-09 | DRG: 854 | Disposition: A | Discharge status: Home / Self Care | Payer: Medicare HMO | Attending: Family Medicine | Admitting: Family Medicine

## 2020-03-07 ENCOUNTER — Emergency Department (HOSPITAL_COMMUNITY): Payer: Medicare HMO

## 2020-03-07 ENCOUNTER — Inpatient Hospital Stay (HOSPITAL_COMMUNITY): Payer: Medicare HMO | Admitting: Certified Registered Nurse Anesthetist

## 2020-03-07 ENCOUNTER — Other Ambulatory Visit: Payer: Self-pay

## 2020-03-07 ENCOUNTER — Inpatient Hospital Stay (HOSPITAL_COMMUNITY): Payer: Medicare HMO

## 2020-03-07 ENCOUNTER — Encounter (HOSPITAL_COMMUNITY): Payer: Self-pay

## 2020-03-07 ENCOUNTER — Encounter (HOSPITAL_COMMUNITY)
Admission: EM | Disposition: A | Discharge status: Home / Self Care | Payer: Self-pay | Source: Home / Self Care | Attending: Family Medicine

## 2020-03-07 DIAGNOSIS — N200 Calculus of kidney: Secondary | ICD-10-CM

## 2020-03-07 DIAGNOSIS — R109 Unspecified abdominal pain: Secondary | ICD-10-CM | POA: Diagnosis not present

## 2020-03-07 DIAGNOSIS — Z6832 Body mass index (BMI) 32.0-32.9, adult: Secondary | ICD-10-CM

## 2020-03-07 DIAGNOSIS — Z881 Allergy status to other antibiotic agents status: Secondary | ICD-10-CM | POA: Diagnosis not present

## 2020-03-07 DIAGNOSIS — R319 Hematuria, unspecified: Secondary | ICD-10-CM | POA: Diagnosis not present

## 2020-03-07 DIAGNOSIS — G4700 Insomnia, unspecified: Secondary | ICD-10-CM | POA: Diagnosis not present

## 2020-03-07 DIAGNOSIS — K219 Gastro-esophageal reflux disease without esophagitis: Secondary | ICD-10-CM | POA: Diagnosis not present

## 2020-03-07 DIAGNOSIS — Z713 Dietary counseling and surveillance: Secondary | ICD-10-CM

## 2020-03-07 DIAGNOSIS — Z20822 Contact with and (suspected) exposure to covid-19: Secondary | ICD-10-CM | POA: Diagnosis not present

## 2020-03-07 DIAGNOSIS — Z1623 Resistance to quinolones and fluoroquinolones: Secondary | ICD-10-CM | POA: Diagnosis not present

## 2020-03-07 DIAGNOSIS — Z79891 Long term (current) use of opiate analgesic: Secondary | ICD-10-CM | POA: Diagnosis not present

## 2020-03-07 DIAGNOSIS — A419 Sepsis, unspecified organism: Secondary | ICD-10-CM | POA: Diagnosis not present

## 2020-03-07 DIAGNOSIS — F418 Other specified anxiety disorders: Secondary | ICD-10-CM | POA: Diagnosis not present

## 2020-03-07 DIAGNOSIS — N135 Crossing vessel and stricture of ureter without hydronephrosis: Secondary | ICD-10-CM | POA: Diagnosis not present

## 2020-03-07 DIAGNOSIS — N390 Urinary tract infection, site not specified: Secondary | ICD-10-CM

## 2020-03-07 DIAGNOSIS — E6609 Other obesity due to excess calories: Secondary | ICD-10-CM | POA: Diagnosis present

## 2020-03-07 DIAGNOSIS — G8929 Other chronic pain: Secondary | ICD-10-CM | POA: Diagnosis present

## 2020-03-07 DIAGNOSIS — Z419 Encounter for procedure for purposes other than remedying health state, unspecified: Secondary | ICD-10-CM

## 2020-03-07 DIAGNOSIS — M79605 Pain in left leg: Secondary | ICD-10-CM | POA: Diagnosis present

## 2020-03-07 DIAGNOSIS — N132 Hydronephrosis with renal and ureteral calculous obstruction: Secondary | ICD-10-CM | POA: Diagnosis not present

## 2020-03-07 DIAGNOSIS — K449 Diaphragmatic hernia without obstruction or gangrene: Secondary | ICD-10-CM | POA: Diagnosis not present

## 2020-03-07 DIAGNOSIS — B9620 Unspecified Escherichia coli [E. coli] as the cause of diseases classified elsewhere: Secondary | ICD-10-CM | POA: Diagnosis present

## 2020-03-07 DIAGNOSIS — N136 Pyonephrosis: Secondary | ICD-10-CM | POA: Diagnosis not present

## 2020-03-07 DIAGNOSIS — R110 Nausea: Secondary | ICD-10-CM | POA: Diagnosis not present

## 2020-03-07 DIAGNOSIS — Z79899 Other long term (current) drug therapy: Secondary | ICD-10-CM | POA: Diagnosis not present

## 2020-03-07 DIAGNOSIS — Z66 Do not resuscitate: Secondary | ICD-10-CM | POA: Diagnosis present

## 2020-03-07 DIAGNOSIS — N281 Cyst of kidney, acquired: Secondary | ICD-10-CM | POA: Diagnosis present

## 2020-03-07 DIAGNOSIS — R1032 Left lower quadrant pain: Secondary | ICD-10-CM | POA: Diagnosis not present

## 2020-03-07 DIAGNOSIS — Z9689 Presence of other specified functional implants: Secondary | ICD-10-CM | POA: Diagnosis not present

## 2020-03-07 DIAGNOSIS — R231 Pallor: Secondary | ICD-10-CM | POA: Diagnosis not present

## 2020-03-07 DIAGNOSIS — K5750 Diverticulosis of both small and large intestine without perforation or abscess without bleeding: Secondary | ICD-10-CM | POA: Diagnosis not present

## 2020-03-07 DIAGNOSIS — F32A Depression, unspecified: Secondary | ICD-10-CM | POA: Diagnosis present

## 2020-03-07 DIAGNOSIS — R0902 Hypoxemia: Secondary | ICD-10-CM | POA: Diagnosis not present

## 2020-03-07 DIAGNOSIS — I10 Essential (primary) hypertension: Secondary | ICD-10-CM | POA: Diagnosis not present

## 2020-03-07 HISTORY — PX: CYSTOSCOPY W/ URETERAL STENT PLACEMENT: SHX1429

## 2020-03-07 LAB — BASIC METABOLIC PANEL
Anion gap: 9 (ref 5–15)
BUN: 14 mg/dL (ref 8–23)
CO2: 25 mmol/L (ref 22–32)
Calcium: 9.2 mg/dL (ref 8.9–10.3)
Chloride: 106 mmol/L (ref 98–111)
Creatinine, Ser: 1.13 mg/dL — ABNORMAL HIGH (ref 0.44–1.00)
GFR, Estimated: 53 mL/min — ABNORMAL LOW (ref >60)
Glucose, Bld: 154 mg/dL — ABNORMAL HIGH (ref 70–99)
Potassium: 3.9 mmol/L (ref 3.5–5.1)
Sodium: 140 mmol/L (ref 135–145)

## 2020-03-07 LAB — URINALYSIS, ROUTINE W REFLEX MICROSCOPIC
Bilirubin Urine: NEGATIVE
Glucose, UA: NEGATIVE mg/dL
Ketones, ur: 15 mg/dL — AB
Nitrite: POSITIVE — AB
Protein, ur: 30 mg/dL — AB
Specific Gravity, Urine: >1.03 — ABNORMAL HIGH (ref 1.005–1.030)
pH: 5.5 (ref 5.0–8.0)

## 2020-03-07 LAB — CBC WITH DIFFERENTIAL/PLATELET
Abs Immature Granulocytes: 0.04 10*3/uL (ref 0.00–0.07)
Basophils Absolute: 0 10*3/uL (ref 0.0–0.1)
Basophils Relative: 0 %
Eosinophils Absolute: 0 10*3/uL (ref 0.0–0.5)
Eosinophils Relative: 0 %
HCT: 45 % (ref 36.0–46.0)
Hemoglobin: 14.8 g/dL (ref 12.0–15.0)
Immature Granulocytes: 0 %
Lymphocytes Relative: 2 %
Lymphs Abs: 0.3 10*3/uL — ABNORMAL LOW (ref 0.7–4.0)
MCH: 27.9 pg (ref 26.0–34.0)
MCHC: 32.9 g/dL (ref 30.0–36.0)
MCV: 84.9 fL (ref 80.0–100.0)
Monocytes Absolute: 0.8 10*3/uL (ref 0.1–1.0)
Monocytes Relative: 6 %
Neutro Abs: 11.2 10*3/uL — ABNORMAL HIGH (ref 1.7–7.7)
Neutrophils Relative %: 92 %
Platelets: 178 10*3/uL (ref 150–400)
RBC: 5.3 MIL/uL — ABNORMAL HIGH (ref 3.87–5.11)
RDW: 12.2 % (ref 11.5–15.5)
WBC: 12.3 10*3/uL — ABNORMAL HIGH (ref 4.0–10.5)
nRBC: 0 % (ref 0.0–0.2)

## 2020-03-07 LAB — HIV ANTIBODY (ROUTINE TESTING W REFLEX): HIV Screen 4th Generation wRfx: NONREACTIVE

## 2020-03-07 LAB — LACTIC ACID, PLASMA
Lactic Acid, Venous: 1.3 mmol/L (ref 0.5–1.9)
Lactic Acid, Venous: 2.3 mmol/L (ref 0.5–1.9)

## 2020-03-07 LAB — PROCALCITONIN: Procalcitonin: 17.51 ng/mL

## 2020-03-07 LAB — RESP PANEL BY RT-PCR (FLU A&B, COVID) ARPGX2
Influenza A by PCR: NEGATIVE
Influenza B by PCR: NEGATIVE
SARS Coronavirus 2 by RT PCR: NEGATIVE

## 2020-03-07 LAB — URINALYSIS, MICROSCOPIC (REFLEX)
RBC / HPF: >50 RBC/hpf (ref 0–5)
WBC, UA: >50 WBC/hpf (ref 0–5)

## 2020-03-07 SURGERY — CYSTOSCOPY, WITH RETROGRADE PYELOGRAM AND URETERAL STENT INSERTION
Anesthesia: General | Site: Ureter | Laterality: Left

## 2020-03-07 MED ORDER — ACETAMINOPHEN 650 MG RE SUPP: 650.0000 mg | Freq: Four times a day (QID) | RECTAL | Status: DC | PRN

## 2020-03-07 MED ORDER — CHLORHEXIDINE GLUCONATE 0.12 % MT SOLN
OROMUCOSAL | Status: AC
  Administered 2020-03-07: 15 mL via OROMUCOSAL
  Filled 2020-03-07: qty 15

## 2020-03-07 MED ORDER — HYDROMORPHONE HCL 1 MG/ML IJ SOLN
0.5000 mg | Freq: Once | INTRAMUSCULAR | Status: AC
  Administered 2020-03-07: 0.5 mg via INTRAVENOUS
  Filled 2020-03-07: qty 1

## 2020-03-07 MED ORDER — ORAL CARE MOUTH RINSE: 15.0000 mL | Freq: Once | OROMUCOSAL | Status: AC

## 2020-03-07 MED ORDER — LACTATED RINGERS IV BOLUS (SEPSIS): 1000.0000 mL | Freq: Once | INTRAVENOUS | Status: DC

## 2020-03-07 MED ORDER — LACTATED RINGERS IV SOLN: INTRAVENOUS | Status: DC

## 2020-03-07 MED ORDER — FLUOXETINE HCL 20 MG PO CAPS
40.0000 mg | ORAL_CAPSULE | Freq: Every day | ORAL | Status: DC
  Administered 2020-03-08 – 2020-03-09 (×2): 40 mg via ORAL
  Filled 2020-03-07 (×3): qty 2

## 2020-03-07 MED ORDER — SODIUM CHLORIDE 0.9 % IV SOLN
1.0000 g | Freq: Once | INTRAVENOUS | Status: AC
  Administered 2020-03-07: 1 g via INTRAVENOUS
  Filled 2020-03-07: qty 10

## 2020-03-07 MED ORDER — PHENYLEPHRINE HCL-NACL 10-0.9 MG/250ML-% IV SOLN
INTRAVENOUS | Status: DC | PRN
  Administered 2020-03-07: 80 ug/min via INTRAVENOUS

## 2020-03-07 MED ORDER — ONDANSETRON HCL 4 MG/2ML IJ SOLN: 4.0000 mg | Freq: Four times a day (QID) | INTRAMUSCULAR | Status: DC | PRN

## 2020-03-07 MED ORDER — PROPOFOL 10 MG/ML IV BOLUS
INTRAVENOUS | Status: DC | PRN
  Administered 2020-03-07: 150 mg via INTRAVENOUS

## 2020-03-07 MED ORDER — PROPOFOL 10 MG/ML IV BOLUS
INTRAVENOUS | Status: AC
  Filled 2020-03-07: qty 20

## 2020-03-07 MED ORDER — PHENYLEPHRINE HCL (PRESSORS) 10 MG/ML IV SOLN
INTRAVENOUS | Status: DC | PRN
  Administered 2020-03-07: 80 ug via INTRAVENOUS

## 2020-03-07 MED ORDER — IOHEXOL 300 MG/ML  SOLN
INTRAMUSCULAR | Status: DC | PRN
  Administered 2020-03-07: 3 mL via URETHRAL

## 2020-03-07 MED ORDER — MORPHINE SULFATE (PF) 2 MG/ML IV SOLN: 2.0000 mg | INTRAVENOUS | Status: DC | PRN

## 2020-03-07 MED ORDER — SODIUM CHLORIDE 0.9 % IR SOLN
Status: DC | PRN
  Administered 2020-03-07: 1000 mL
  Administered 2020-03-07: 3000 mL

## 2020-03-07 MED ORDER — LACTATED RINGERS IV BOLUS (SEPSIS)
1000.0000 mL | Freq: Once | INTRAVENOUS | Status: AC
  Administered 2020-03-07: 1000 mL via INTRAVENOUS

## 2020-03-07 MED ORDER — PROMETHAZINE HCL 25 MG/ML IJ SOLN: 6.2500 mg | INTRAMUSCULAR | Status: DC | PRN

## 2020-03-07 MED ORDER — ACETAMINOPHEN 325 MG PO TABS
650.0000 mg | ORAL_TABLET | Freq: Four times a day (QID) | ORAL | Status: DC | PRN
  Administered 2020-03-08: 650 mg via ORAL
  Filled 2020-03-07 (×2): qty 2

## 2020-03-07 MED ORDER — FENTANYL CITRATE (PF) 100 MCG/2ML IJ SOLN
INTRAMUSCULAR | Status: DC | PRN
  Administered 2020-03-07: 100 ug via INTRAVENOUS

## 2020-03-07 MED ORDER — CHLORHEXIDINE GLUCONATE 0.12 % MT SOLN: 15.0000 mL | Freq: Once | OROMUCOSAL | Status: AC

## 2020-03-07 MED ORDER — KETOROLAC TROMETHAMINE 30 MG/ML IJ SOLN
30.0000 mg | Freq: Once | INTRAMUSCULAR | Status: AC
  Administered 2020-03-07: 30 mg via INTRAVENOUS
  Filled 2020-03-07: qty 1

## 2020-03-07 MED ORDER — ACETAMINOPHEN 10 MG/ML IV SOLN
INTRAVENOUS | Status: DC | PRN
  Administered 2020-03-07: 1000 mg via INTRAVENOUS

## 2020-03-07 MED ORDER — HYDROMORPHONE HCL 1 MG/ML IJ SOLN
1.0000 mg | Freq: Once | INTRAMUSCULAR | Status: AC
  Administered 2020-03-07: 1 mg via INTRAVENOUS
  Filled 2020-03-07: qty 1

## 2020-03-07 MED ORDER — ACETAMINOPHEN 10 MG/ML IV SOLN
INTRAVENOUS | Status: AC
  Filled 2020-03-07: qty 100

## 2020-03-07 MED ORDER — CHLORHEXIDINE GLUCONATE CLOTH 2 % EX PADS
6.0000 | MEDICATED_PAD | Freq: Every day | CUTANEOUS | Status: DC
  Administered 2020-03-07 – 2020-03-08 (×2): 6 via TOPICAL

## 2020-03-07 MED ORDER — TRAZODONE HCL 50 MG PO TABS
100.0000 mg | ORAL_TABLET | Freq: Every day | ORAL | Status: DC
  Administered 2020-03-07 – 2020-03-08 (×2): 100 mg via ORAL
  Filled 2020-03-07 (×2): qty 2

## 2020-03-07 MED ORDER — SODIUM CHLORIDE 0.9% FLUSH
3.0000 mL | Freq: Two times a day (BID) | INTRAVENOUS | Status: DC
  Administered 2020-03-08: 3 mL via INTRAVENOUS

## 2020-03-07 MED ORDER — DEXAMETHASONE SODIUM PHOSPHATE 4 MG/ML IJ SOLN
INTRAMUSCULAR | Status: DC | PRN
  Administered 2020-03-07: 4 mg via INTRAVENOUS

## 2020-03-07 MED ORDER — ENOXAPARIN SODIUM 40 MG/0.4ML ~~LOC~~ SOLN
40.0000 mg | SUBCUTANEOUS | Status: DC
  Administered 2020-03-08 – 2020-03-09 (×2): 40 mg via SUBCUTANEOUS
  Filled 2020-03-07 (×2): qty 0.4

## 2020-03-07 MED ORDER — ONDANSETRON HCL 4 MG PO TABS: 4.0000 mg | ORAL_TABLET | Freq: Four times a day (QID) | ORAL | Status: DC | PRN

## 2020-03-07 MED ORDER — ONDANSETRON HCL 4 MG/2ML IJ SOLN
INTRAMUSCULAR | Status: AC
  Filled 2020-03-07: qty 2

## 2020-03-07 MED ORDER — ONDANSETRON HCL 4 MG/2ML IJ SOLN
4.0000 mg | Freq: Once | INTRAMUSCULAR | Status: AC
  Administered 2020-03-07: 4 mg via INTRAVENOUS
  Filled 2020-03-07: qty 2

## 2020-03-07 MED ORDER — LIDOCAINE HCL (CARDIAC) PF 100 MG/5ML IV SOSY
PREFILLED_SYRINGE | INTRAVENOUS | Status: DC | PRN
  Administered 2020-03-07: 80 mg via INTRAVENOUS

## 2020-03-07 MED ORDER — SUCCINYLCHOLINE CHLORIDE 20 MG/ML IJ SOLN
INTRAMUSCULAR | Status: DC | PRN
  Administered 2020-03-07: 100 mg via INTRAVENOUS

## 2020-03-07 MED ORDER — BUPROPION HCL ER (XL) 150 MG PO TB24
300.0000 mg | ORAL_TABLET | Freq: Every day | ORAL | Status: DC
  Administered 2020-03-08 – 2020-03-09 (×2): 300 mg via ORAL
  Filled 2020-03-07 (×2): qty 2

## 2020-03-07 MED ORDER — ONDANSETRON HCL 4 MG/2ML IJ SOLN
INTRAMUSCULAR | Status: DC | PRN
  Administered 2020-03-07: 4 mg via INTRAVENOUS

## 2020-03-07 MED ORDER — SODIUM CHLORIDE 0.9 % IV SOLN
1.0000 g | INTRAVENOUS | Status: DC
  Administered 2020-03-08 – 2020-03-09 (×2): 1 g via INTRAVENOUS
  Filled 2020-03-07: qty 10
  Filled 2020-03-07: qty 1

## 2020-03-07 MED ORDER — FENTANYL CITRATE (PF) 100 MCG/2ML IJ SOLN: 25.0000 ug | INTRAMUSCULAR | Status: DC | PRN

## 2020-03-07 MED ORDER — DEXAMETHASONE SODIUM PHOSPHATE 10 MG/ML IJ SOLN
INTRAMUSCULAR | Status: AC
  Filled 2020-03-07: qty 1

## 2020-03-07 MED ORDER — FENTANYL CITRATE (PF) 250 MCG/5ML IJ SOLN
INTRAMUSCULAR | Status: AC
  Filled 2020-03-07: qty 5

## 2020-03-07 MED ORDER — MORPHINE SULFATE (PF) 4 MG/ML IV SOLN
4.0000 mg | INTRAVENOUS | Status: DC | PRN
  Administered 2020-03-08: 4 mg via INTRAVENOUS
  Filled 2020-03-07: qty 1

## 2020-03-07 MED ORDER — EPHEDRINE SULFATE 50 MG/ML IJ SOLN
INTRAMUSCULAR | Status: DC | PRN
  Administered 2020-03-07: 15 mg via INTRAVENOUS
  Administered 2020-03-07: 10 mg via INTRAVENOUS

## 2020-03-07 MED ORDER — LIDOCAINE 2% (20 MG/ML) 5 ML SYRINGE
INTRAMUSCULAR | Status: AC
  Filled 2020-03-07: qty 5

## 2020-03-07 SURGICAL SUPPLY — 18 items
BAG URINE DRAIN 2000ML AR STRL (UROLOGICAL SUPPLIES) ×3 IMPLANT
BAG URO CATCHER STRL LF (MISCELLANEOUS) ×1 IMPLANT
CATH FOLEY 2WAY 5CC 16FR (CATHETERS)
CATH URET 5FR 28IN OPEN ENDED (CATHETERS) ×4 IMPLANT
CATH URTH STD 16FR FL 2W DRN (CATHETERS) ×1 IMPLANT
GLOVE BIOGEL M 7.0 STRL (GLOVE) ×3 IMPLANT
GOWN STRL REUS W/TWL LRG LVL3 (GOWN DISPOSABLE) ×3 IMPLANT
GUIDEWIRE ANG ZIPWIRE 038X150 (WIRE) IMPLANT
GUIDEWIRE STR DUAL SENSOR (WIRE) ×4 IMPLANT
IV NS 1000ML (IV SOLUTION) ×2
IV NS 1000ML BAXH (IV SOLUTION) ×1 IMPLANT
KIT TURNOVER KIT A (KITS) IMPLANT
MANIFOLD NEPTUNE II (INSTRUMENTS) ×3 IMPLANT
PACK CYSTO (CUSTOM PROCEDURE TRAY) ×3 IMPLANT
STENT CONTOUR 6FRX24X.038 (STENTS) IMPLANT
STENT CONTOUR 6FRX26X.038 (STENTS) ×3 IMPLANT
TUBING CONNECTING 10 (TUBING) ×2 IMPLANT
TUBING CONNECTING 10' (TUBING) ×1

## 2020-03-07 NOTE — Anesthesia Procedure Notes (Signed)
Procedure Name: Intubation Date/Time: 03/07/2020 4:22 PM Performed by: Oletta Lamas, CRNA Pre-anesthesia Checklist: Patient identified, Emergency Drugs available, Suction available and Patient being monitored Patient Re-evaluated:Patient Re-evaluated prior to induction Oxygen Delivery Method: Circle System Utilized Preoxygenation: Pre-oxygenation with 100% oxygen Induction Type: IV induction Ventilation: Mask ventilation without difficulty Laryngoscope Size: Mac and 4 Grade View: Grade I Tube type: Oral Tube size: 7.0 mm Number of attempts: 1 Airway Equipment and Method: Stylet and Oral airway Placement Confirmation: ETT inserted through vocal cords under direct vision,  positive ETCO2 and breath sounds checked- equal and bilateral Secured at: 22 cm Tube secured with: Tape Dental Injury: Teeth and Oropharynx as per pre-operative assessment

## 2020-03-07 NOTE — Sepsis Progress Note (Signed)
eLINK tracking Code Sepsis. Blood cultures drawn at 14:54. Antibiotic given First at 12:27.

## 2020-03-07 NOTE — ED Provider Notes (Signed)
[facility] EMERGENCY DEPARTMENT Provider Note   CSN: [id number] Arrival date & time: 03/07/20  8546     History Chief Complaint  Patient presents with  . Flank Pain    Karla Watson is a 70 y.o. female.  HPI Patient presents with left flank/back pain.  Began last night.  States she did have nausea and dry heaving however a couple days before that.  No back pain at that time.  Pain is severe.  States she has been sweaty.  States she felt like passing out.  Said no dysuria.  No fevers or chills.  Has a history of a kidney abscess on the right side.  States that there was a stone with infection above it.  That however was on the contralateral side.  States she feels weak in her legs.  States she feels though if the pain was improved she would be able to walk fine.  Pain is dull.  States may feel better standing up.  Cannot get a necessarily comfortable position.  No diarrhea or constipation.  States she had a Covid test done yesterday.  Has been vaccinated and boosted.    Past Medical History:  Diagnosis Date  . Depression   . Insomnia   . Kidney abscess   . Pleurisy   . Vertigo     There are no problems to display for this patient.   Past Surgical History:  Procedure Laterality Date  . CATARACT EXTRACTION    . CEREBRAL ANEURYSM REPAIR    . TUBAL LIGATION       OB History   No obstetric history on file.     Family History  Problem Relation Age of Onset  . Breast cancer Mother   . Prostate cancer Father     Social History   Tobacco Use  . Smoking status: Never Smoker  . Smokeless tobacco: Never Used  Substance Use Topics  . Alcohol use: Not Currently  . Drug use: Never    Home Medications Prior to Admission medications   Medication Sig Start Date End Date Taking? Authorizing Provider  buPROPion (WELLBUTRIN XL) 300 MG 24 hr tablet Take 300 mg by mouth daily.   Yes [provider]  FLUoxetine (PROZAC) 40 MG capsule Take 40 mg by  mouth daily.   Yes [provider]  HYDROcodone-acetaminophen (NORCO/VICODIN) 5-325 MG tablet Take 1-2 tablets by mouth every 6 (six) hours as needed for severe pain. 12/22/18  Yes Gwyneth Sprout, MD  traZODone (DESYREL) 100 MG tablet Take 100 mg by mouth at bedtime.   Yes [provider]    Allergies    Erythromycin  Review of Systems   Review of Systems  Constitutional: Positive for appetite change. Negative for fever.  HENT: Negative for congestion.   Respiratory: Negative for cough.   Cardiovascular: Negative for chest pain.  Gastrointestinal: Positive for nausea and vomiting.  Genitourinary: Positive for flank pain.  Musculoskeletal: Positive for back pain and gait problem.  Skin: Negative for rash and wound.  Neurological: Positive for weakness.  Psychiatric/Behavioral: Negative for confusion.    Physical Exam Updated Vital Signs BP (!) 161/85   Pulse 72   Temp (!) 101.2 F (38.4 C) (Oral)   Resp 20   Ht 5\' 3"  (1.6 m)   Wt 84.4 kg   SpO2 95%   BMI 32.96 kg/m   Physical Exam Vitals and nursing note reviewed.  Constitutional:      Appearance: Normal appearance.  Comments: Patient appears uncomfortable.  HENT:     Head: Atraumatic.     Mouth/Throat:     Mouth: Mucous membranes are moist.  Eyes:     Pupils: Pupils are equal, round, and reactive to light.  Cardiovascular:     Rate and Rhythm: Normal rate and regular rhythm.  Pulmonary:     Breath sounds: No wheezing or rhonchi.  Abdominal:     Tenderness: There is no abdominal tenderness.  Genitourinary:    Comments: CVA tenderness on left.  No rash.  No deformity. Musculoskeletal:        General: Normal range of motion.     Cervical back: Neck supple.  Skin:    General: Skin is warm.     Capillary Refill: Capillary refill takes less than 2 seconds.  Neurological:     Mental Status: She is alert and oriented to person, place, and time.     ED Results / Procedures / Treatments    Labs (all labs ordered are listed, but only abnormal results are displayed) Labs Reviewed  URINALYSIS, ROUTINE W REFLEX MICROSCOPIC - Abnormal; Notable for the following components:      Result Value   Color, Urine AMBER (*)    APPearance TURBID (*)    Specific Gravity, Urine >1.030 (*)    Hgb urine dipstick LARGE (*)    Ketones, ur 15 (*)    Protein, ur 30 (*)    Nitrite POSITIVE (*)    Leukocytes,Ua MODERATE (*)    All other components within normal limits  BASIC METABOLIC PANEL - Abnormal; Notable for the following components:   Glucose, Bld 154 (*)    Creatinine, Ser 1.13 (*)    GFR, Estimated 53 (*)    All other components within normal limits  CBC WITH DIFFERENTIAL/PLATELET - Abnormal; Notable for the following components:   WBC 12.3 (*)    RBC 5.30 (*)    Neutro Abs 11.2 (*)    Lymphs Abs 0.3 (*)    All other components within normal limits  URINALYSIS, MICROSCOPIC (REFLEX) - Abnormal; Notable for the following components:   Bacteria, UA MANY (*)    All other components within normal limits  RESP PANEL BY RT-PCR (FLU A&B, COVID) ARPGX2  CULTURE, BLOOD (SINGLE)  LACTIC ACID, PLASMA  LACTIC ACID, PLASMA    EKG None  Radiology CT Renal Stone Study  Result Date: 03/07/2020 CLINICAL DATA:  Left flank pain.  Dry heaves. EXAM: CT ABDOMEN AND PELVIS WITHOUT CONTRAST TECHNIQUE: Multidetector CT imaging of the abdomen and pelvis was performed following the standard protocol without IV contrast. COMPARISON:  None. FINDINGS: Lower chest:  Small sliding hiatal hernia. Hepatobiliary: No focal liver abnormality.No evidence of biliary obstruction or stone. Pancreas: Unremarkable. Spleen: Unremarkable. Adrenals/Urinary Tract: 14 mm low-density right adrenal nodule consistent with adenoma. 6 mm left UPJ calculus with mild hydronephrosis and low-density renal expansion. No additional renal calculi. 17 mm right renal cyst. Unremarkable bladder. Stomach/Bowel: No obstruction. No  appendicitis. Left colonic diverticulosis. Vascular/Lymphatic: No acute vascular abnormality. Atheromatous calcification. No mass or adenopathy. Reproductive:No pathologic findings. Other: No ascites or pneumoperitoneum. Musculoskeletal: No acute abnormalities. Lower lumbar spine degeneration. IMPRESSION: 1. Obstructing 6 mm left UPJ calculus. 2. Chronic findings are described above. Electronically Signed   By: Marnee Spring M.D.   On: 03/07/2020 10:34    Procedures Procedures   Medications Ordered in ED Medications  lactated ringers infusion (has no administration in time range)  lactated ringers bolus 1,000 mL (has  no administration in time range)    And  lactated ringers bolus 1,000 mL (has no administration in time range)    And  lactated ringers bolus 1,000 mL (has no administration in time range)  HYDROmorphone (DILAUDID) injection 1 mg (1 mg Intravenous Given 03/07/20 0844)  HYDROmorphone (DILAUDID) injection 0.5 mg (0.5 mg Intravenous Given 03/07/20 1148)  cefTRIAXone (ROCEPHIN) 1 g in sodium chloride 0.9 % 100 mL IVPB (0 g Intravenous Stopped 03/07/20 1301)  HYDROmorphone (DILAUDID) injection 0.5 mg (0.5 mg Intravenous Given 03/07/20 1300)  ondansetron (ZOFRAN) injection 4 mg (4 mg Intravenous Given 03/07/20 1301)    ED Course  I have reviewed the triage vital signs and the nursing notes.  Pertinent labs & imaging results that were available during my care of the patient were reviewed by me and considered in my medical decision making (see chart for details).    MDM Rules/Calculators/A&P                          Patient presented with left leg pain.  Began somewhat acutely but had nausea and vomiting a couple days prior.  Previous kidney stone but also previous kidney abscess on the contralateral side.  White count only mildly elevated and not febrile initially.  Good blood pressure.  CT scan shows large proximal stone.  Urine showed potential infection but not septic initially.   Discussed with Dr. Cardell Peach from urology, plan to take to the OR for stenting.  Remained in the ER pending preop, however developed a temperature 101.2 and heart rate up to 140.  At this point code sepsis was called.  Had already had the antibiotics but fluid bolus not initially before since it could worsen the pain and did not appear septic.  Will discuss with urology but likely require medicine admission at this time. Lactic acid added on as was blood culture.  Blood cultures and lactic acid will be drawn after antibiotics since patient did not initially have a septic picture.  Discussed with Dr. Cardell Peach from urology y.  Will require medicine admission now.  Will discuss with hospitalist since PCP is with [facility]  CRITICAL CARE Performed by: Benjiman Core Total critical care time: 30 minutes Critical care time was exclusive of separately billable procedures and treating other patients. Critical care was necessary to treat or prevent imminent or life-threatening deterioration. Critical care was time spent personally by me on the following activities: development of treatment plan with patient and/or surrogate as well as nursing, discussions with consultants, evaluation of patient's response to treatment, examination of patient, obtaining history from patient or surrogate, ordering and performing treatments and interventions, ordering and review of laboratory studies, ordering and review of radiographic studies, pulse oximetry and re-evaluation of patient's condition.  Final Clinical Impression(s) / ED Diagnoses Final diagnoses:  Kidney stone on left side  Urinary tract infection with hematuria, site unspecified  Sepsis, due to unspecified organism, unspecified whether acute organ dysfunction present [facility])    Rx / DC Orders ED Discharge Orders    None       Benjiman Core, MD 03/07/20 1452

## 2020-03-07 NOTE — ED Notes (Signed)
Pt verbalized understanding of needing urine sample. 

## 2020-03-07 NOTE — Anesthesia Postprocedure Evaluation (Signed)
Anesthesia Post Note  Patient: Karla Watson  Procedure(s) Performed: CYSTOSCOPY WITH RETROGRADE PYELOGRAM/URETERAL STENT PLACEMENT (Left Ureter)     Patient location during evaluation: PACU Anesthesia Type: General Level of consciousness: awake and alert Pain management: pain level controlled Vital Signs Assessment: post-procedure vital signs reviewed and stable Respiratory status: spontaneous breathing, nonlabored ventilation, respiratory function stable and patient connected to nasal cannula oxygen Cardiovascular status: blood pressure returned to baseline and stable Postop Assessment: no apparent nausea or vomiting Anesthetic complications: no   No complications documented.  Last Vitals:  Vitals:   03/07/20 1915 03/07/20 1945  BP: (!) 145/74 (!) 133/58  Pulse: 97 89  Resp: 19 18  Temp: (!) 36.3 C   SpO2: 96% 95%    Last Pain:  Vitals:   03/07/20 1945  TempSrc:   PainSc: 0-No pain                 Cecile Hearing

## 2020-03-07 NOTE — Op Note (Addendum)
Operative Note  Preoperative diagnosis:  1.  Left obstructing ureteral stone 2. Early sepsis due to urinary source  Postoperative diagnosis: 1.  Left obstructing ureteral stone 2. Early sepsis due to urinary source  Procedure(s): 1.  Cystoscopy 2. Left retrograde pyelogram with interpretation 3. Left ureteral stent placement  Surgeon: Jettie Pagan, MD  Assistants:  None  Anesthesia:  General  Complications:  None  EBL:  Minimal  Specimens: 1. none  Drains/Catheters: 1.  Left 6Fr x 26cm ureteral stent  Intraoperative findings:   1. Cystoscopy demonstrated urothelium consistent with cystitis throughout her bladder. No suspicious malignant lesions were seen. She did have 4+ trabeculation. 2. Left retrograde pyelogram demonstrated minimal left hydronephrosis. There is no hydronephrotic drip. I was unable to aspirate a left renal pelvis urine culture. After removing the five [place] open-ended catheter over the sensor wire, there was return of purulent urine from her left ureteral orifice. 3. Successful left ureteral stent placement with curl in the renal pelvis and bladder respectively.  Indication:  Karla Watson is a 70 y.o. female with obstructing left UPJ stone measuring 6 mm with signs of infection. After reviewing the management options for treatment, she elected to proceed with the above surgical procedure(s). We have discussed the potential benefits and risks of the procedure, side effects of the proposed treatment, the likelihood of the patient achieving the goals of the procedure, and any potential problems that might occur during the procedure or recuperation. Informed consent has been obtained.  Description of procedure: The patient was taken to the operating room and general anesthesia was induced.  The patient was placed in the dorsal lithotomy position, prepped and draped in the usual sterile fashion, and preoperative antibiotics were administered. A preoperative  time-out was performed.   Cystourethroscopy was performed.  The patient's urethra was examined and was normal. The bladder was then systematically examined in its entirety. There was no evidence for any bladder tumors, stones. Urothelium throughout was consistent with cystitis.  Attention then turned to the left ureteral orifice. A 0.038 sensor wire was passed through the left ureteral orifice and over the wire a 5 Fr open ended catheter was inserted and passed up to the level of the renal pelvis. There is no hydronephrotic drip. I was unable to obtain aspirate from her left renal pelvis.  Omnipaque contrast was injected through the ureteral catheter. And a retrograde pyelogram was performed with findings as dictated above. The wire was then replaced and the open ended catheter was removed after measuring this to be 25 cm. There was return of purulent urine after this five [place] open-ended catheter was removed.  A 6Fr x 26cm ureteral stent was advance over the wire. The stent was positioned appropriately under fluoroscopic and cystoscopic guidance.  The wire was then removed with an adequate stent curl noted in the renal pelvis as well as in the bladder. Although this was previously measured 25 cm, it appeared that 26cm stent was slightly long. I used a grasper to pull the stent back approximately 1 cm. A curl was seen within the left renal pelvis and appropriate position. The stone which represented a filling defect was identified within the left renal pelvis.  The bladder was then emptied, a 16 Fr foley was placed and the procedure ended.  The patient appeared to tolerate the procedure well and without complications.  The patient was able to be awakened and transferred to the recovery unit in satisfactory condition.   Plan:  Admit to medicine  given sepsis. Continue broad spectrum abx. Foley catheter until afebrile for 24 hours. F/u culture data. Will need outpatient elective management of left ureteral  stone.  Matt R. Simcha Farrington MD [facility]  Pager: [phone]

## 2020-03-07 NOTE — H&P (Signed)
History and Physical    [name] XBL:[id number] DOB: [birth year] DOA: 03/07/2020  PCP: [facility] Consultants:  None Patient coming from:  Home - lives with husband, Karla Watson; [place], [phone], [phone]  Chief Complaint: flank pain  HPI: Karla Watson is a 70 y.o. female with medical history significant of h/o R renal abscess and nephrolithiasis presenting with L flank pain.  2 nights in row, she had dry heaves lasting for 6 hours.  Last night, she got L flank pain that came on suddenly.  She was having difficulty walking around, she fell at one point but doesn't remember falling.    She had polyuria and dysuria previously.  She went to the doctor and was diagnosed as a UTI but a few days later went to the hospital.  Her WBC was very high.  She had a nephrostomy tube placed.  That was about 15 years ago.    ED Course:  Infected, obstructed kidney stone.  Developed sepsis physiology in the ER.  Proximal 6 mm stone, UA looked infected.  Rocephin given, lactate ordered.  Urology plans to take to OR today.  Review of Systems: As per HPI; otherwise review of systems reviewed and negative.   Ambulatory Status:  Ambulates without assistance  COVID Vaccine Status:   Complete  Past Medical History:  Diagnosis Date  . Depression   . Insomnia   . Kidney abscess   . Pleurisy   . Vertigo     Past Surgical History:  Procedure Laterality Date  . CATARACT EXTRACTION    . CEREBRAL ANEURYSM REPAIR    . TUBAL LIGATION      Social History   Socioeconomic History  . Marital status: Married    Spouse name: Not on file  . Number of children: Not on file  . Years of education: Not on file  . Highest education level: Not on file  Occupational History  . Not on file  Tobacco Use  . Smoking status: Never Smoker  . Smokeless tobacco: Never Used  Substance and Sexual Activity  . Alcohol use: Yes    Comment: rarely  . Drug use: Never   . Sexual activity: Not on file  Other Topics Concern  . Not on file  Social History Narrative  . Not on file   Social Determinants of Health   Financial Resource Strain: Not on file  Food Insecurity: Not on file  Transportation Needs: Not on file  Physical Activity: Not on file  Stress: Not on file  Social Connections: Not on file  Intimate Partner Violence: Not on file    Allergies  Allergen Reactions  . Erythromycin Hives    Family History  Problem Relation Age of Onset  . Breast cancer Mother   . Prostate cancer Father     Prior to Admission medications   Medication Sig Start Date End Date Taking? Authorizing Provider  buPROPion (WELLBUTRIN XL) 300 MG 24 hr tablet Take 300 mg by mouth daily.   Yes [provider]  FLUoxetine (PROZAC) 40 MG capsule Take 40 mg by mouth daily.   Yes [provider]  HYDROcodone-acetaminophen (NORCO/VICODIN) 5-325 MG tablet Take 1-2 tablets by mouth every 6 (six) hours as needed for severe pain. 12/22/18  Yes Gwyneth Sprout, MD  traZODone (DESYREL) 100 MG tablet Take 100 mg by mouth at bedtime.   Yes [provider]    Physical Exam: Vitals:   03/07/20 1228 03/07/20 1300 03/07/20 1435  03/07/20 1500  BP: 135/72 (!) 161/85  (!) 169/62  Pulse: 75 72  89  Resp: 15 20  17   Temp:   (!) 101.2 F (38.4 C)   TempSrc:   Oral   SpO2: 94% 95%  94%  Weight:      Height:         . General:  Appears ill, uncomfortable with colicky pain . Eyes:  PERRL, EOMI, normal lids, iris . ENT:  grossly normal hearing, lips & tongue, mildly dry mm; appropriate dentition . Neck:  no LAD, masses or thyromegaly . Cardiovascular:  RRR, no m/r/g. No LE edema.  Respiratory:   CTA bilaterally with no wheezes/rales/rhonchi.  Normal respiratory effort. . Abdomen:  soft, NT, ND, NABS . Back:   normal alignment, marked L CVAT . Skin:  no rash or induration seen on limited exam . Musculoskeletal:  grossly normal tone BUE/BLE, good  ROM, no bony abnormality . Psychiatric:  grossly normal mood and affect, speech fluent and appropriate, AOx3 . Neurologic:  CN 2-12 grossly intact, moves all extremities in coordinated fashion    Radiological Exams on Admission: Independently reviewed - see discussion in A/P where applicable  CT Renal Stone Study  Result Date: 03/07/2020 CLINICAL DATA:  Left flank pain.  Dry heaves. EXAM: CT ABDOMEN AND PELVIS WITHOUT CONTRAST TECHNIQUE: Multidetector CT imaging of the abdomen and pelvis was performed following the standard protocol without IV contrast. COMPARISON:  None. FINDINGS: Lower chest:  Small sliding hiatal hernia. Hepatobiliary: No focal liver abnormality.No evidence of biliary obstruction or stone. Pancreas: Unremarkable. Spleen: Unremarkable. Adrenals/Urinary Tract: 14 mm low-density right adrenal nodule consistent with adenoma. 6 mm left UPJ calculus with mild hydronephrosis and low-density renal expansion. No additional renal calculi. 17 mm right renal cyst. Unremarkable bladder. Stomach/Bowel: No obstruction. No appendicitis. Left colonic diverticulosis. Vascular/Lymphatic: No acute vascular abnormality. Atheromatous calcification. No mass or adenopathy. Reproductive:No pathologic findings. Other: No ascites or pneumoperitoneum. Musculoskeletal: No acute abnormalities. Lower lumbar spine degeneration. IMPRESSION: 1. Obstructing 6 mm left UPJ calculus. 2. Chronic findings are described above. Electronically Signed   By: 03/09/2020 M.D.   On: 03/07/2020 10:34    EKG: not done   Labs on Admission: I have personally reviewed the available labs and imaging studies at the time of the admission.  Pertinent labs:   Glucose 154 BUN 14/Creatinine 1.13/GFR 53 Lactate 1.3 WBC 12.3 COVID/flu negative UA: large Hgb, 15 ketones, moderate LE, +nitrite, 30 protein, many bacteria   Assessment/Plan Principal Problem:   Sepsis due to urinary tract infection (HCC) Active Problems:    UPJ (ureteropelvic junction) obstruction   Depression with anxiety   Class 1 obesity due to excess calories with body mass index (BMI) of 32.0 to 32.9 in adult   Sepsis associated with obstructing stone -Sepsis indicates life-threatening organ dysfunction with mortality >10%, caused by dysregulation to host response.   -SIRS criteria in this patient includes: Leukocytosis, fever  -Patient has an obstructing stone appreciated on CT - 69mm L UPJ calculus -While awaiting blood cultures, this appears to be a preseptic condition. -Sepsis protocol initiated -Blood and urine cultures pending -Will admit due to: need for operative intervention -Treat with IV Rocephin for presumed urinary source -Will order procalcitonin level.  As the procalcitonin level normalizes, it will be reasonable to consider de-escalation of antibiotic coverage. -This patient is at risk for shock and may require vasopressors to keep MAP >65 and/or due to lactate >2 despite volume resuscitation; shock is associated  with >40% mortality. -She is heading to the OR fairly soon for cystoscopy and stent placement with Dr. Cardell Peach.  Depression/anxiety -Continue Wellbutrin, Prozac, Trazodone  Chronic pain -I have reviewed this patient in the [place] Controlled Substances Reporting System.   -She is not receiving chronic controlled substance prescriptions and received a total of 10 pills on the registry, prescribed in 12/2018 (Vicodin)  Obesity -Body mass index is 32.96 kg/m..  -Weight loss should be encouraged -Outpatient PCP/bariatric medicine f/u encouraged   Note: This patient has been tested and is negative for the novel coronavirus COVID-19. She has been fully vaccinated against COVID-19.    DVT prophylaxis:  Lovenox Code Status:  DNR - confirmed with patient Family Communication: None present; I was unable to reach her husband by house or mobile telephone numbers at the time of admission Disposition Plan:  The patient is  from: home  Anticipated d/c is to: home without [facility] services   Anticipated d/c date will depend on clinical response to treatment, likely 2-3 days  Patient is currently: acutely ill Consults called: Urology Admission status: Admit - It is my clinical opinion that admission to INPATIENT is reasonable and necessary because of the expectation that this patient will require hospital care that crosses at least 2 midnights to treat this condition based on the medical complexity of the problems presented.  Given the aforementioned information, the predictability of an adverse outcome is felt to be significant.     Jonah Blue MD [facility]   How to contact the [facility] Attending or Consulting provider 7A - 7P or covering provider during after hours 7P -7A, for this patient?  1. Check the care team in [facility] and look for a) attending/consulting TR[facility] provider listed and b) the [facility] team listed 2. Log into [email or web address] and use [place]'s universal password to access. If you do not have the password, please contact the hospital operator. 3. Locate the [facility] provider you are looking for under [facility] and page to a number that you can be directly reached. 4. If you still have difficulty reaching the provider, please page the [facility] (Director on Call) for the Hospitalists listed on amion for assistance.   03/07/2020, 3:33 PM

## 2020-03-07 NOTE — Discharge Instructions (Signed)
·   Diet: You should advance your diet as instructed by your physician.  It will be normal to have some bloating, nausea, and abdominal discomfort intermittently.   Prescriptions:  You will be provided a prescription for pain medication to take as needed.  If your pain is not severe enough to require the prescription pain medication, you may take extra strength Tylenol instead which will have less side effects.  You should also take a prescribed stool softener to avoid straining with bowel movements as the prescription pain medication may constipate you.   What to call us about: You should call the office [phone]) if you develop fever > 101 or develop persistent vomiting. Activity:  You are encouraged to ambulate frequently (about every hour during waking hours) to help prevent blood clots from forming in your legs or lungs.  However, you should not engage in any heavy lifting (> 10-15 lbs), strenuous activity, or straining.  You have a left ureteral stent in place. This is temporary and must be removed or exchanged within 3 months.

## 2020-03-07 NOTE — ED Triage Notes (Signed)
Patient arrives from home with [facility], patietn with L sided flank pain starting about midnight, hx of kidney abscess on R side, also endorses hx of kidney stones, states that is that her pain feels like. EMS gave 4 mg Zofran and 250 mcg Fentanyl enroute.   EMS vitals 133/76 HR 68 RR 20 98% RA CBG 200

## 2020-03-07 NOTE — Anesthesia Preprocedure Evaluation (Addendum)
Anesthesia Evaluation  Patient identified by MRN, date of birth, ID band Patient awake    Reviewed: Allergy & Precautions, NPO status , Patient's Chart, lab work & pertinent test results  Airway Mallampati: II  TM Distance: >3 FB Neck ROM: Full    Dental  (+) Teeth Intact, Dental Advisory Given   Pulmonary former smoker,    Pulmonary exam normal breath sounds clear to auscultation       Cardiovascular Exercise Tolerance: Good (-) hypertension(-) angina(-) Past MI negative cardio ROS Normal cardiovascular exam Rhythm:Regular Rate:Normal     Neuro/Psych neg Seizures PSYCHIATRIC DISORDERS Anxiety Depression CEREBRAL ANEURYSM REPAIR    GI/Hepatic Neg liver ROS, GERD  Poorly Controlled,  Endo/Other  negative endocrine ROSObesity   Renal/GU Renal disease     Musculoskeletal negative musculoskeletal ROS (+)   Abdominal   Peds  Hematology negative hematology ROS (+)   Anesthesia Other Findings Day of surgery medications reviewed with the patient.  Reproductive/Obstetrics                            Anesthesia Physical Anesthesia Plan  ASA: III  Anesthesia Plan: General   Post-op Pain Management:    Induction: Intravenous  PONV Risk Score and Plan: 3 and Dexamethasone and Ondansetron  Airway Management Planned: Oral ETT  Additional Equipment:   Intra-op Plan:   Post-operative Plan: Extubation in OR  Informed Consent: I have reviewed the patients History and Physical, chart, labs and discussed the procedure including the risks, benefits and alternatives for the proposed anesthesia with the patient or authorized representative who has indicated his/her understanding and acceptance.   Patient has DNR.  Discussed DNR with patient and Continue DNR.   Dental advisory given  Plan Discussed with: CRNA  Anesthesia Plan Comments:        Anesthesia Quick Evaluation

## 2020-03-07 NOTE — ED Notes (Signed)
Pt ambulated to restroom and back to room with steady gait. Stand by assist present for safety. Pt endorses feeling "sick to her stomach" once back in bed and increased pain from 2 to 6/10

## 2020-03-07 NOTE — Progress Notes (Signed)
New order received.

## 2020-03-07 NOTE — H&P (Signed)
Urology Consult   Physician requesting consult: Jonah Blue, MD  Reason for consult: Left ureteral stone with signs of infection  History of Present Illness: Karla Watson is a 70 y.o. with a past urologic history segment for a right renal abscess with obstructing stone status post right percutaneous drain placement in [place] many years ago presented the hospital today for acute left flank pain over the past 48 hours.  She has had left leg pain associated with nausea and emesis.  Is been associate with fevers and tachycardia in the emergency department.    CT A/P 03/07/2020 with 24mm obstructing left proximal ureteral stone. UA with positive nitrite. WBC 12.3.   Past Medical History:  Diagnosis Date  . Depression   . Insomnia   . Kidney abscess   . Pleurisy   . Vertigo     Past Surgical History:  Procedure Laterality Date  . CATARACT EXTRACTION    . CEREBRAL ANEURYSM REPAIR    . TUBAL LIGATION       Current Hospital Medications:  Home meds:  No current facility-administered medications on file prior to encounter.   Current Outpatient Medications on File Prior to Encounter  Medication Sig Dispense Refill  . buPROPion (WELLBUTRIN XL) 300 MG 24 hr tablet Take 300 mg by mouth daily.    Marland Kitchen FLUoxetine (PROZAC) 40 MG capsule Take 40 mg by mouth daily.    Marland Kitchen HYDROcodone-acetaminophen (NORCO/VICODIN) 5-325 MG tablet Take 1-2 tablets by mouth every 6 (six) hours as needed for severe pain. 10 tablet 0  . traZODone (DESYREL) 100 MG tablet Take 100 mg by mouth at bedtime.       Scheduled Meds: . [MAR Hold] buPROPion  300 mg Oral Daily  . [MAR Hold] enoxaparin (LOVENOX) injection  40 mg Subcutaneous Q24H  . [MAR Hold] FLUoxetine  40 mg Oral Daily  . [MAR Hold] sodium chloride flush  3 mL Intravenous Q12H  . [MAR Hold] traZODone  100 mg Oral QHS   Continuous Infusions: . [MAR Hold] cefTRIAXone (ROCEPHIN)  IV    . [MAR Hold] lactated ringers     And  . [MAR Hold] lactated  ringers    . lactated ringers    . lactated ringers 10 mL/hr at 03/07/20 1557   PRN Meds:.[MAR Hold] acetaminophen **OR** [MAR Hold] acetaminophen, [MAR Hold]  morphine injection, [MAR Hold] ondansetron **OR** [MAR Hold] ondansetron (ZOFRAN) IV  Allergies:  Allergies  Allergen Reactions  . Erythromycin Hives    Family History  Problem Relation Age of Onset  . Breast cancer Mother   . Prostate cancer Father     Social History:  reports that she has never smoked. She has never used smokeless tobacco. She reports current alcohol use. She reports that she does not use drugs.  ROS: A complete review of systems was performed.  All systems are negative except for pertinent findings as noted.  Physical Exam:  Vital signs in last 24 hours: Temp:  [97.9 F (36.6 C)-101.2 F (38.4 C)] 101.2 F (38.4 C) (02/17 1435) Pulse Rate:  [65-89] 89 (02/17 1500) Resp:  [15-20] 17 (02/17 1500) BP: (132-179)/(51-85) 169/62 (02/17 1500) SpO2:  [90 %-96 %] 94 % (02/17 1500) Weight:  [84.4 kg] 84.4 kg (02/17 0642) Constitutional:  Alert and oriented, No acute distress Cardiovascular: Regular rate and rhythm Respiratory: Normal respiratory effort, Lungs clear bilaterally GI: Abdomen is soft, nontender, nondistended, no abdominal masses GU: No CVA tenderness Neurologic: Grossly intact, no focal deficits Psychiatric: Normal mood and affect  Laboratory Data:  Recent Labs    03/07/20 0842  WBC 12.3*  HGB 14.8  HCT 45.0  PLT 178    Recent Labs    03/07/20 0842  NA 140  K 3.9  CL 106  GLUCOSE 154*  BUN 14  CALCIUM 9.2  CREATININE 1.13*     Results for orders placed or performed during the hospital encounter of 03/07/20 (from the past 24 hour(s))  Basic metabolic panel     Status: Abnormal   Collection Time: 03/07/20  8:42 AM  Result Value Ref Range   Sodium 140 135 - 145 mmol/L   Potassium 3.9 3.5 - 5.1 mmol/L   Chloride 106 98 - 111 mmol/L   CO2 25 22 - 32 mmol/L   Glucose,  Bld 154 (H) 70 - 99 mg/dL   BUN 14 8 - 23 mg/dL   Creatinine, Ser 2.58 (H) 0.44 - 1.00 mg/dL   Calcium 9.2 8.9 - 52.7 mg/dL   GFR, Estimated 53 (L) >60 mL/min   Anion gap 9 5 - 15  CBC with Differential     Status: Abnormal   Collection Time: 03/07/20  8:42 AM  Result Value Ref Range   WBC 12.3 (H) 4.0 - 10.5 K/uL   RBC 5.30 (H) 3.87 - 5.11 MIL/uL   Hemoglobin 14.8 12.0 - 15.0 g/dL   HCT 78.2 42.3 - 53.6 %   MCV 84.9 80.0 - 100.0 fL   MCH 27.9 26.0 - 34.0 pg   MCHC 32.9 30.0 - 36.0 g/dL   RDW 14.4 31.5 - 40.0 %   Platelets 178 150 - 400 K/uL   nRBC 0.0 0.0 - 0.2 %   Neutrophils Relative % 92 %   Neutro Abs 11.2 (H) 1.7 - 7.7 K/uL   Lymphocytes Relative 2 %   Lymphs Abs 0.3 (L) 0.7 - 4.0 K/uL   Monocytes Relative 6 %   Monocytes Absolute 0.8 0.1 - 1.0 K/uL   Eosinophils Relative 0 %   Eosinophils Absolute 0.0 0.0 - 0.5 K/uL   Basophils Relative 0 %   Basophils Absolute 0.0 0.0 - 0.1 K/uL   Immature Granulocytes 0 %   Abs Immature Granulocytes 0.04 0.00 - 0.07 K/uL  Urinalysis, Routine w reflex microscopic Urine, Clean Catch     Status: Abnormal   Collection Time: 03/07/20 10:50 AM  Result Value Ref Range   Color, Urine AMBER (A) YELLOW   APPearance TURBID (A) CLEAR   Specific Gravity, Urine >1.030 (H) 1.005 - 1.030   pH 5.5 5.0 - 8.0   Glucose, UA NEGATIVE NEGATIVE mg/dL   Hgb urine dipstick LARGE (A) NEGATIVE   Bilirubin Urine NEGATIVE NEGATIVE   Ketones, ur 15 (A) NEGATIVE mg/dL   Protein, ur 30 (A) NEGATIVE mg/dL   Nitrite POSITIVE (A) NEGATIVE   Leukocytes,Ua MODERATE (A) NEGATIVE  Resp Panel by RT-PCR (Flu A&B, Covid) Urine, Clean Catch     Status: None   Collection Time: 03/07/20 10:50 AM   Specimen: Urine, Clean Catch; Nasopharyngeal(NP) swabs in vial transport medium  Result Value Ref Range   SARS Coronavirus 2 by RT PCR NEGATIVE NEGATIVE   Influenza A by PCR NEGATIVE NEGATIVE   Influenza B by PCR NEGATIVE NEGATIVE  Urinalysis, Microscopic (reflex)      Status: Abnormal   Collection Time: 03/07/20 10:50 AM  Result Value Ref Range   RBC / HPF >50 0 - 5 RBC/hpf   WBC, UA >50 0 - 5 WBC/hpf   Bacteria, UA  MANY (A) NONE SEEN   Squamous Epithelial / LPF 6-10 0 - 5   Mucus PRESENT   Lactic acid, plasma     Status: None   Collection Time: 03/07/20  2:41 PM  Result Value Ref Range   Lactic Acid, Venous 1.3 0.5 - 1.9 mmol/L   Recent Results (from the past 240 hour(s))  Resp Panel by RT-PCR (Flu A&B, Covid) Urine, Clean Catch     Status: None   Collection Time: 03/07/20 10:50 AM   Specimen: Urine, Clean Catch; Nasopharyngeal(NP) swabs in vial transport medium  Result Value Ref Range Status   SARS Coronavirus 2 by RT PCR NEGATIVE NEGATIVE Final    Comment: (NOTE) SARS-CoV-2 target nucleic acids are NOT DETECTED.  The SARS-CoV-2 RNA is generally detectable in upper respiratory specimens during the acute phase of infection. The lowest concentration of SARS-CoV-2 viral copies this assay can detect is 138 copies/mL. A negative result does not preclude SARS-Cov-2 infection and should not be used as the sole basis for treatment or other patient management decisions. A negative result may occur with  improper specimen collection/handling, submission of specimen other than nasopharyngeal swab, presence of viral mutation(s) within the areas targeted by this assay, and inadequate number of viral copies(<138 copies/mL). A negative result must be combined with clinical observations, patient history, and epidemiological information. The expected result is Negative.  Fact Sheet for Patients:  BloggerCourse.com  Fact Sheet for Healthcare Providers:  SeriousBroker.it  This test is no t yet approved or cleared by the [place] FDA and  has been authorized for detection and/or diagnosis of SARS-CoV-2 by FDA under an Emergency Use Authorization (EUA). This EUA will remain  in effect (meaning this  test can be used) for the duration of the COVID-19 declaration under Section 564(b)(1) of the Act, 21 U.S.C.section 360bbb-3(b)(1), unless the authorization is terminated  or revoked sooner.       Influenza A by PCR NEGATIVE NEGATIVE Final   Influenza B by PCR NEGATIVE NEGATIVE Final    Comment: (NOTE) The Xpert Xpress SARS-CoV-2/FLU/RSV plus assay is intended as an aid in the diagnosis of influenza from Nasopharyngeal swab specimens and should not be used as a sole basis for treatment. Nasal washings and aspirates are unacceptable for Xpert Xpress SARS-CoV-2/FLU/RSV testing.  Fact Sheet for Patients: BloggerCourse.com  Fact Sheet for Healthcare Providers: SeriousBroker.it  This test is not yet approved or cleared by the [place] FDA and has been authorized for detection and/or diagnosis of SARS-CoV-2 by FDA under an Emergency Use Authorization (EUA). This EUA will remain in effect (meaning this test can be used) for the duration of the COVID-19 declaration under Section 564(b)(1) of the Act, 21 U.S.C. section 360bbb-3(b)(1), unless the authorization is terminated or revoked.  Performed at [facility], [address], [place], [place] [address]     Renal Function: Recent Labs    03/07/20 1751  CREATININE 1.13*   Estimated Creatinine Clearance: 49.7 mL/min (A) (by C-G formula based on SCr of 1.13 mg/dL (H)).  Radiologic Imaging: CT Renal Stone Study  Result Date: 03/07/2020 CLINICAL DATA:  Left flank pain.  Dry heaves. EXAM: CT ABDOMEN AND PELVIS WITHOUT CONTRAST TECHNIQUE: Multidetector CT imaging of the abdomen and pelvis was performed following the standard protocol without IV contrast. COMPARISON:  None. FINDINGS: Lower chest:  Small sliding hiatal hernia. Hepatobiliary: No focal liver abnormality.No evidence of biliary obstruction or stone. Pancreas: Unremarkable. Spleen: Unremarkable. Adrenals/Urinary  Tract: 14 mm low-density right adrenal  nodule consistent with adenoma. 6 mm left UPJ calculus with mild hydronephrosis and low-density renal expansion. No additional renal calculi. 17 mm right renal cyst. Unremarkable bladder. Stomach/Bowel: No obstruction. No appendicitis. Left colonic diverticulosis. Vascular/Lymphatic: No acute vascular abnormality. Atheromatous calcification. No mass or adenopathy. Reproductive:No pathologic findings. Other: No ascites or pneumoperitoneum. Musculoskeletal: No acute abnormalities. Lower lumbar spine degeneration. IMPRESSION: 1. Obstructing 6 mm left UPJ calculus. 2. Chronic findings are described above. Electronically Signed   By: Marnee SpringJonathon  Watts M.D.   On: 03/07/2020 10:34    I independently reviewed the above imaging studies.  Impression/Recommendation: 1. Left proximal ureteral stone with signs of early sepsis  --The risks, benefits and alternatives of cystoscopy with left JJ stent placement was discussed with the patient.  Risks include, but are not limited to: bleeding, urinary tract infection, ureteral injury, ureteral stricture disease, chronic pain, urinary symptoms, bladder injury, stent migration, the need for nephrostomy tube placement, MI, CVA, DVT, PE and the inherent risks with general anesthesia.  The patient voices understanding and wishes to proceed.    Matt R. Kenzo Ozment MD 03/07/2020, 4:09 PM  [facility]  Pager: [phone]   CC: Jonah BlueJennifer Yates, MD

## 2020-03-07 NOTE — Progress Notes (Addendum)
1 Day Post-Op Subjective: Pain controlled. No nausea or emesis. Temp curve downtrending.  Objective: Vital signs in last 24 hours: Temp:  [97.2 F (36.2 C)-101.2 F (38.4 C)] 98.2 F (36.8 C) (02/18 1048) Pulse Rate:  [65-145] 65 (02/18 1048) Resp:  [13-27] 16 (02/18 1048) BP: (126-169)/(58-77) 148/59 (02/18 1048) SpO2:  [92 %-99 %] 95 % (02/18 1048)  Intake/Output from previous day: 02/17 0701 - 02/18 0700 In: 2050.4 [P.O.:120; I.V.:1830.4; IV Piggyback:100] Out: 650 [Urine:650] Intake/Output this shift: No intake/output data recorded.  Physical Exam:  General: Alert and oriented CV: RRR Lungs: Clear Abdomen: Soft, ND, NT Ext: NT, No erythema  Lab Results: Recent Labs    03/07/20 0842 03/08/20 0025  HGB 14.8 12.9  HCT 45.0 38.8   BMET Recent Labs    03/07/20 0842 03/08/20 0025  NA 140 136  K 3.9 4.2  CL 106 104  CO2 25 25  GLUCOSE 154* 170*  BUN 14 12  CREATININE 1.13* 1.06*  CALCIUM 9.2 8.6*     Studies/Results: DG Retrograde Pyelogram  Result Date: 03/07/2020 CLINICAL DATA:  Retrograde pyelogram EXAM: RETROGRADE PYELOGRAM COMPARISON:  03/07/2020 FINDINGS: The patient has undergone placement of a ureteral stent on the left. Two images were submitted. The first image demonstrates contrast injection into the collecting system with a possible filling defect at the level of the renal pelvis on the left. Final images demonstrate a catheter projecting over the renal pelvis apparent the total fluoroscopy time was 17 seconds. The total dose was 3.42 mGy. IMPRESSION: Ureteral stent placement as detailed above. Please see separate operative report for complete intraoperative details. Electronically Signed   By: Katherine Mantle M.D.   On: 03/07/2020 17:23   CT Renal Stone Study  Result Date: 03/07/2020 CLINICAL DATA:  Left flank pain.  Dry heaves. EXAM: CT ABDOMEN AND PELVIS WITHOUT CONTRAST TECHNIQUE: Multidetector CT imaging of the abdomen and pelvis was  performed following the standard protocol without IV contrast. COMPARISON:  None. FINDINGS: Lower chest:  Small sliding hiatal hernia. Hepatobiliary: No focal liver abnormality.No evidence of biliary obstruction or stone. Pancreas: Unremarkable. Spleen: Unremarkable. Adrenals/Urinary Tract: 14 mm low-density right adrenal nodule consistent with adenoma. 6 mm left UPJ calculus with mild hydronephrosis and low-density renal expansion. No additional renal calculi. 17 mm right renal cyst. Unremarkable bladder. Stomach/Bowel: No obstruction. No appendicitis. Left colonic diverticulosis. Vascular/Lymphatic: No acute vascular abnormality. Atheromatous calcification. No mass or adenopathy. Reproductive:No pathologic findings. Other: No ascites or pneumoperitoneum. Musculoskeletal: No acute abnormalities. Lower lumbar spine degeneration. IMPRESSION: 1. Obstructing 6 mm left UPJ calculus. 2. Chronic findings are described above. Electronically Signed   By: Marnee Spring M.D.   On: 03/07/2020 10:34    Assessment/Plan: 1. Obstructing LEFT ureteral stone: CT A/P 03/07/20 with 27mm proximal left ureteral stone with hydro. No other stones. S/p cysto, L stent on 03/07/2020.  2. Sepsis due to urinary source: Presented febrile to 101.2, WBC 12.3, UA with positive nitrite. UCx 100K E coli. 3. Right adrenal adenoma: Seen on CT A/P 03/07/20. Will obtain adrenal labs after infection resolves. 4. Right renal cyst: 48mm right renal cyst on CT A/P 03/07/20. Benign appearing.   -S/p left ureteral stent on 03/07/2020 -Some gross hematuria, expected after stent placement. See through light pink. No clots. -Continue broad spectrum abx -Urine cx 100K E coli. Resistant to cipro. Recommend bactrim or ampicillin 1 week PO course. -Void trial once afebrile for 24 hours, likely tomorrow -Will need outpatient f/u for definitive management of her left  ureteral stone. Messaged schedulers to arrange. -Following peripherally. Please call with  questions.   LOS: 1 day   Matt R. Tivon Lemoine MD 03/08/2020, 1:29 PM [facility]  Pager: [phone]

## 2020-03-07 NOTE — Transfer of Care (Signed)
Immediate Anesthesia Transfer of Care Note  Patient: Karla Watson  Procedure(s) Performed: CYSTOSCOPY WITH RETROGRADE PYELOGRAM/URETERAL STENT PLACEMENT (Left Ureter)  Patient Location: PACU  Anesthesia Type:General  Level of Consciousness: drowsy and patient cooperative  Airway & Oxygen Therapy: Patient Spontanous Breathing and Patient connected to nasal cannula oxygen  Post-op Assessment: Report given to RN and Post -op Vital signs reviewed and stable  Post vital signs: Reviewed and stable  Last Vitals:  Vitals Value Taken Time  BP 146/68 03/07/20 1714  Temp    Pulse 106 03/07/20 1719  Resp 18 03/07/20 1719  SpO2 100 % 03/07/20 1719  Vitals shown include unvalidated device data.  Last Pain:  Vitals:   03/07/20 1509  TempSrc:   PainSc: 2          Complications: No complications documented.

## 2020-03-07 NOTE — Progress Notes (Signed)
Results for JAZZMON, PRINDLE (MRN [id number]) as of 03/07/2020 22:16  Ref. Range 03/07/2020 20:33  Lactic Acid, Venous Latest Ref Range: 0.5 - 1.9 mmol/L 2.3 (HH)  MD on call paged. Awaiting return call.

## 2020-03-08 ENCOUNTER — Encounter (HOSPITAL_COMMUNITY): Payer: Self-pay | Admitting: Urology

## 2020-03-08 DIAGNOSIS — R319 Hematuria, unspecified: Secondary | ICD-10-CM

## 2020-03-08 DIAGNOSIS — N200 Calculus of kidney: Secondary | ICD-10-CM

## 2020-03-08 DIAGNOSIS — N135 Crossing vessel and stricture of ureter without hydronephrosis: Secondary | ICD-10-CM

## 2020-03-08 LAB — CBC
HCT: 38.8 % (ref 36.0–46.0)
Hemoglobin: 12.9 g/dL (ref 12.0–15.0)
MCH: 28.4 pg (ref 26.0–34.0)
MCHC: 33.2 g/dL (ref 30.0–36.0)
MCV: 85.5 fL (ref 80.0–100.0)
Platelets: 153 10*3/uL (ref 150–400)
RBC: 4.54 MIL/uL (ref 3.87–5.11)
RDW: 12.5 % (ref 11.5–15.5)
WBC: 14.4 10*3/uL — ABNORMAL HIGH (ref 4.0–10.5)
nRBC: 0 % (ref 0.0–0.2)

## 2020-03-08 LAB — BASIC METABOLIC PANEL
Anion gap: 7 (ref 5–15)
BUN: 12 mg/dL (ref 8–23)
CO2: 25 mmol/L (ref 22–32)
Calcium: 8.6 mg/dL — ABNORMAL LOW (ref 8.9–10.3)
Chloride: 104 mmol/L (ref 98–111)
Creatinine, Ser: 1.06 mg/dL — ABNORMAL HIGH (ref 0.44–1.00)
GFR, Estimated: 58 mL/min — ABNORMAL LOW (ref >60)
Glucose, Bld: 170 mg/dL — ABNORMAL HIGH (ref 70–99)
Potassium: 4.2 mmol/L (ref 3.5–5.1)
Sodium: 136 mmol/L (ref 135–145)

## 2020-03-08 LAB — MAGNESIUM: Magnesium: 1.9 mg/dL (ref 1.7–2.4)

## 2020-03-08 LAB — LACTIC ACID, PLASMA: Lactic Acid, Venous: 1.1 mmol/L (ref 0.5–1.9)

## 2020-03-08 NOTE — Plan of Care (Signed)

## 2020-03-08 NOTE — Progress Notes (Signed)
[facility]  PROGRESS NOTE  Karla Watson NUU:[id number] DOB: [birth year] DOA: 03/07/2020 PCP: [facility]   Brief HPI:   70 year old female with medical history of right renal abscess, nephrolithiasis presented with left flank pain.  CT abdomen showed obstructing 6 mm left UPJ calculus.  Urology was consulted.  Patient underwent cystoscopy and stent placement and left ureter.    Subjective   Patient seen and examined, denies any complaints.  Denies dysuria.  Complains of mild low abdominal cramping.   Assessment/Plan:     1. Sepsis-due to UTI/obstructing ureteral stone.  Sepsis physiology has resolved.  Patient presented with obstructing stone on CT with 6 mm left UPJ calculus.  Urology was consulted, ureteral stent was placed.  Urine culture grew > 100.000 colonies per mL Escherichia coli, resistant to ciprofloxacin.  Patient is currently on ceftriaxone. 2. Left ureteral stone-CT scan showed 6 mm left UPJ calculus, patient underwent cystoscopy with left stent placement on 03/07/2020.  Patient to follow-up urology for definitive management of left ureteral stone.  Voiding trial after patient is afebrile for 24 hours, likely tomorrow. 3. UTI-urine culture grew E. coli, currently she is on ceftriaxone.  Urology recommended to discharge on p.o. ampicillin for 1 week 4. Depression/anxiety-continue Wellbutrin, Prozac, trazodone. 5. Adrenal incidentaloma- 14 mm adrenal adenoma seen on CT scan.  She will need work-up to assess for functioning adenoma.  Urology will order labs once patient infection resolves as outpatient.      COVID-19 Labs  No results for input(Karla): DDIMER, FERRITIN, LDH, CRP in the last 72 hours.  Lab Results  Component Value Date   SARSCOV2NAA NEGATIVE 03/07/2020     Scheduled medications:   . buPROPion  300 mg Oral Daily  . Chlorhexidine Gluconate Cloth  6 each Topical Daily  . enoxaparin (LOVENOX) injection  40 mg Subcutaneous Q24H   . FLUoxetine  40 mg Oral Daily  . sodium chloride flush  3 mL Intravenous Q12H  . traZODone  100 mg Oral QHS    CBG: No results for input(Karla): GLUCAP in the last 168 hours.  SpO2: 95 % O2 Flow Rate (L/min): 2 L/min    CBC: Recent Labs  Lab 03/07/20 0842 03/08/20 0025  WBC 12.3* 14.4*  NEUTROABS 11.2*  --   HGB 14.8 12.9  HCT 45.0 38.8  MCV 84.9 85.5  PLT 178 153    Basic Metabolic Panel: Recent Labs  Lab 03/07/20 0842 03/08/20 0025  NA 140 136  K 3.9 4.2  CL 106 104  CO2 25 25  GLUCOSE 154* 170*  BUN 14 12  CREATININE 1.13* 1.06*  CALCIUM 9.2 8.6*  MG  --  1.9     Liver Function Tests: No results for input(Karla): AST, ALT, ALKPHOS, BILITOT, PROT, ALBUMIN in the last 168 hours.   Antibiotics: Anti-infectives (From admission, onward)   Start     Dose/Rate Route Frequency Ordered Stop   03/08/20 1200  cefTRIAXone (ROCEPHIN) 1 g in sodium chloride 0.9 % 100 mL IVPB        1 g 200 mL/hr over 30 Minutes Intravenous Every 24 hours 03/07/20 1522     03/07/20 1215  cefTRIAXone (ROCEPHIN) 1 g in sodium chloride 0.9 % 100 mL IVPB        1 g 200 mL/hr over 30 Minutes Intravenous  Once 03/07/20 1208 03/07/20 1301       DVT prophylaxis: Lovenox  Code Status: DNR  Family Communication: No family at bedside   Consultants:  Urology  Procedures:  Left ureteral stent placement, cystoscopy    Objective   Vitals:   03/07/20 2010 03/07/20 2040 03/08/20 0353 03/08/20 1048  BP: 136/61 138/77 (!) 153/68 (!) 148/59  Pulse: 86 84 78 65  Resp: 15 16 18 16   Temp: (!) 97.2 F (36.2 C) 98.3 F (36.8 C) 97.9 F (36.6 C) 98.2 F (36.8 C)  TempSrc:   Oral Oral  SpO2: 97% 99% 95% 95%  Weight:      Height:        Intake/Output Summary (Last 24 hours) at 03/08/2020 1353 Last data filed at 03/08/2020 0700 Gross per 24 hour  Intake 1950.37 ml  Output 650 ml  Net 1300.37 ml    02/16 1901 - 02/18 0700 In: 2050.4 [P.O.:120; I.V.:1830.4] Out: 650  [Urine:650]  Filed Weights   03/07/20 0642  Weight: 84.4 kg    Physical Examination:    General-appears in no acute distress  Heart-S1-S2, regular, no murmur auscultated  Lungs-clear to auscultation bilaterally, no wheezing or crackles auscultated  Abdomen-soft, nontender, no organomegaly  Extremities-no edema in the lower extremities  Neuro-alert, oriented x3, no focal deficit noted   Status is: Inpatient  Dispo: The patient is from: Home              Anticipated d/c is to: Home              Anticipated d/c date is: 03/09/2020              Patient currently not stable for discharge  Barrier to discharge-IV antibiotics for infected ureteral stone       Data Reviewed:   Recent Results (from the past 240 hour(Karla))  Resp Panel by RT-PCR (Flu A&B, Covid) Urine, Clean Catch     Status: None   Collection Time: 03/07/20 10:50 AM   Specimen: Urine, Clean Catch; Nasopharyngeal(NP) swabs in vial transport medium  Result Value Ref Range Status   SARS Coronavirus 2 by RT PCR NEGATIVE NEGATIVE Final    Comment: (NOTE) SARS-CoV-2 target nucleic acids are NOT DETECTED.  The SARS-CoV-2 RNA is generally detectable in upper respiratory specimens during the acute phase of infection. The lowest concentration of SARS-CoV-2 viral copies this assay can detect is 138 copies/mL. A negative result does not preclude SARS-Cov-2 infection and should not be used as the sole basis for treatment or other patient management decisions. A negative result may occur with  improper specimen collection/handling, submission of specimen other than nasopharyngeal swab, presence of viral mutation(Karla) within the areas targeted by this assay, and inadequate number of viral copies(<138 copies/mL). A negative result must be combined with clinical observations, patient history, and epidemiological information. The expected result is Negative.  Fact Sheet for Patients:   03/09/20  Fact Sheet for Healthcare Providers:  BloggerCourse.com  This test is no t yet approved or cleared by the [place] FDA and  has been authorized for detection and/or diagnosis of SARS-CoV-2 by FDA under an Emergency Use Authorization (EUA). This EUA will remain  in effect (meaning this test can be used) for the duration of the COVID-19 declaration under Section 564(b)(1) of the Act, 21 U.Karla.C.section 360bbb-3(b)(1), unless the authorization is terminated  or revoked sooner.       Influenza A by PCR NEGATIVE NEGATIVE Final   Influenza B by PCR NEGATIVE NEGATIVE Final    Comment: (NOTE) The Xpert Xpress SARS-CoV-2/FLU/RSV plus assay is intended as an aid in the diagnosis of influenza from Nasopharyngeal swab specimens  and should not be used as a sole basis for treatment. Nasal washings and aspirates are unacceptable for Xpert Xpress SARS-CoV-2/FLU/RSV testing.  Fact Sheet for Patients: BloggerCourse.com  Fact Sheet for Healthcare Providers: SeriousBroker.it  This test is not yet approved or cleared by the [place] FDA and has been authorized for detection and/or diagnosis of SARS-CoV-2 by FDA under an Emergency Use Authorization (EUA). This EUA will remain in effect (meaning this test can be used) for the duration of the COVID-19 declaration under Section 564(b)(1) of the Act, 21 U.Karla.C. section 360bbb-3(b)(1), unless the authorization is terminated or revoked.  Performed at [facility], [address] [place], [place] [address]     No results for input(Karla): LIPASE, AMYLASE in the last 168 hours. No results for input(Karla): AMMONIA in the last 168 hours.  Cardiac Enzymes: No results for input(Karla): CKTOTAL, CKMB, CKMBINDEX, TROPONINI in the last 168 hours. BNP (last 3 results) No results for input(Karla): BNP in the last 8760 hours.  ProBNP (last 3  results) No results for input(Karla): PROBNP in the last 8760 hours.  Studies:  DG Retrograde Pyelogram  Result Date: 03/07/2020 CLINICAL DATA:  Retrograde pyelogram EXAM: RETROGRADE PYELOGRAM COMPARISON:  03/07/2020 FINDINGS: The patient has undergone placement of a ureteral stent on the left. Two images were submitted. The first image demonstrates contrast injection into the collecting system with a possible filling defect at the level of the renal pelvis on the left. Final images demonstrate a catheter projecting over the renal pelvis apparent the total fluoroscopy time was 17 seconds. The total dose was 3.42 mGy. IMPRESSION: Ureteral stent placement as detailed above. Please see separate operative report for complete intraoperative details. Electronically Signed   By: Katherine Mantle M.D.   On: 03/07/2020 17:23   CT Renal Stone Study  Result Date: 03/07/2020 CLINICAL DATA:  Left flank pain.  Dry heaves. EXAM: CT ABDOMEN AND PELVIS WITHOUT CONTRAST TECHNIQUE: Multidetector CT imaging of the abdomen and pelvis was performed following the standard protocol without IV contrast. COMPARISON:  None. FINDINGS: Lower chest:  Small sliding hiatal hernia. Hepatobiliary: No focal liver abnormality.No evidence of biliary obstruction or stone. Pancreas: Unremarkable. Spleen: Unremarkable. Adrenals/Urinary Tract: 14 mm low-density right adrenal nodule consistent with adenoma. 6 mm left UPJ calculus with mild hydronephrosis and low-density renal expansion. No additional renal calculi. 17 mm right renal cyst. Unremarkable bladder. Stomach/Bowel: No obstruction. No appendicitis. Left colonic diverticulosis. Vascular/Lymphatic: No acute vascular abnormality. Atheromatous calcification. No mass or adenopathy. Reproductive:No pathologic findings. Other: No ascites or pneumoperitoneum. Musculoskeletal: No acute abnormalities. Lower lumbar spine degeneration. IMPRESSION: 1. Obstructing 6 mm left UPJ calculus. 2. Chronic  findings are described above. Electronically Signed   By: Marnee Spring M.D.   On: 03/07/2020 10:34       Karla Watson Karla Watson   [facility] If 7PM-7AM, please contact night-coverage at [email or web address], Office  [phone]   03/08/2020, 1:53 PM  LOS: 1 day

## 2020-03-08 NOTE — Plan of Care (Signed)

## 2020-03-08 NOTE — Progress Notes (Signed)
Chat with [facility]  Informed 16 beat run of SVT vitals charted in Epic asymptomatic. Ilean Skill LPN

## 2020-03-09 LAB — BASIC METABOLIC PANEL
Anion gap: 8 (ref 5–15)
BUN: 9 mg/dL (ref 8–23)
CO2: 29 mmol/L (ref 22–32)
Calcium: 8.4 mg/dL — ABNORMAL LOW (ref 8.9–10.3)
Chloride: 102 mmol/L (ref 98–111)
Creatinine, Ser: 0.97 mg/dL (ref 0.44–1.00)
GFR, Estimated: >60 mL/min (ref >60)
Glucose, Bld: 119 mg/dL — ABNORMAL HIGH (ref 70–99)
Potassium: 3.7 mmol/L (ref 3.5–5.1)
Sodium: 139 mmol/L (ref 135–145)

## 2020-03-09 LAB — CBC
HCT: 35.8 % — ABNORMAL LOW (ref 36.0–46.0)
Hemoglobin: 11.5 g/dL — ABNORMAL LOW (ref 12.0–15.0)
MCH: 28.1 pg (ref 26.0–34.0)
MCHC: 32.1 g/dL (ref 30.0–36.0)
MCV: 87.5 fL (ref 80.0–100.0)
Platelets: 125 10*3/uL — ABNORMAL LOW (ref 150–400)
RBC: 4.09 MIL/uL (ref 3.87–5.11)
RDW: 12.6 % (ref 11.5–15.5)
WBC: 6.6 10*3/uL (ref 4.0–10.5)
nRBC: 0 % (ref 0.0–0.2)

## 2020-03-09 MED ORDER — BISACODYL 10 MG RE SUPP
10.0000 mg | Freq: Once | RECTAL | Status: AC
  Administered 2020-03-09: 10 mg via RECTAL
  Filled 2020-03-09: qty 1

## 2020-03-09 MED ORDER — HYDROCODONE-ACETAMINOPHEN 5-325 MG PO TABS
1.0000 | ORAL_TABLET | Freq: Four times a day (QID) | ORAL | 0 refills | Status: DC | PRN
Start: 2020-03-09 — End: 2020-04-16

## 2020-03-09 MED ORDER — AMOXICILLIN 500 MG PO CAPS: 500.0000 mg | ORAL_CAPSULE | Freq: Three times a day (TID) | ORAL | 0 refills | Status: AC

## 2020-03-09 NOTE — Discharge Summary (Signed)
Physician Discharge Summary  Karla Watson VZC:[id number] DOB: [birth year] DOA: 03/07/2020  PCP: [facility]  Admit date: 03/07/2020 Discharge date: 03/09/2020  Time spent: 50* minutes  Recommendations for Outpatient Follow-up:  1. Follow-up urology as outpatient  Discharge Diagnoses:  Principal Problem:   Sepsis due to urinary tract infection (HCC) Active Problems:   UPJ (ureteropelvic junction) obstruction   Depression with anxiety   Class 1 obesity due to excess calories with body mass index (BMI) of 32.0 to 32.9 in adult   Discharge Condition: Stable  Diet recommendation: Regular diet  Filed Weights   03/07/20 0642 03/08/20 2011  Weight: 84.4 kg 84.8 kg    History of present illness:  70 year old female with medical history of right renal abscess, nephrolithiasis presented with left flank pain.  CT abdomen showed obstructing 6 mm left UPJ calculus.  Urology was consulted.  Patient underwent cystoscopy and stent placement and left ureter.   Hospital Course:  1. Sepsis-due to UTI/obstructing ureteral stone.  Sepsis physiology has resolved.  Patient presented with obstructing stone on CT with 6 mm left UPJ calculus.  Urology was consulted, ureteral stent was placed.  Urine culture grew > 100.000 colonies per mL Escherichia coli, resistant to ciprofloxacin.  Patient is currently on ceftriaxone.  E. coli is sensitive to ampicillin.  Will discharge on amoxicillin 500 mg p.o. 3 times daily for 1 more week. 2. Left ureteral stone-CT scan showed 6 mm left UPJ calculus, patient underwent cystoscopy with left stent placement on 03/07/2020.  Patient to follow-up urology for definitive management of left ureteral stone.    Foley catheter was removed, patient voided.  Will discharge home today. 3. UTI-urine culture grew E. coli, currently she is on ceftriaxone.  Urology recommended to discharge on p.o. ampicillin for 1 week 4. Depression/anxiety-continue Wellbutrin,  Prozac, trazodone. 5. Adrenal incidentaloma- 14 mm adrenal adenoma seen on CT scan.  She will need work-up to assess for functioning adenoma.  Urology will order labs once patient infection resolves as outpatient.    Procedures:  Cystoscopy and stent placement of left ureter  Consultations: Urology  Discharge Exam: Vitals:   03/09/20 0700 03/09/20 0943  BP: (!) 155/64 (!) 148/58  Pulse: 72 (!) 58  Resp: 16 16  Temp: 98.9 F (37.2 C) 98.5 F (36.9 C)  SpO2: 95% 94%    General: Appears in no acute distress Cardiovascular: S1-S2, regular Respiratory: Clear to auscultation bilaterally  Discharge Instructions   Discharge Instructions    Diet - low sodium heart healthy   Complete by: As directed    Increase activity slowly   Complete by: As directed    No wound care   Complete by: As directed      Allergies as of 03/09/2020      Reactions   Erythromycin Hives      Medication List    TAKE these medications   amoxicillin 500 MG capsule Commonly known as: AMOXIL Take 1 capsule (500 mg total) by mouth 3 (three) times daily for 7 days.   buPROPion 300 MG 24 hr tablet Commonly known as: WELLBUTRIN XL Take 300 mg by mouth daily.   FLUoxetine 40 MG capsule Commonly known as: PROZAC Take 40 mg by mouth daily.   HYDROcodone-acetaminophen 5-325 MG tablet Commonly known as: NORCO/VICODIN Take 1-2 tablets by mouth every 6 (six) hours as needed for severe pain.   traZODone 100 MG tablet Commonly known as: DESYREL Take 100 mg by mouth at bedtime.  Allergies  Allergen Reactions  . Erythromycin Hives    Follow-up Information    Schedule an appointment as soon as possible for a visit with [facility].   Contact information: [address] [place] [address] [phone]               The results of significant diagnostics from this hospitalization (including imaging, microbiology, ancillary and laboratory) are  listed below for reference.    Significant Diagnostic Studies: DG Retrograde Pyelogram  Result Date: 03/07/2020 CLINICAL DATA:  Retrograde pyelogram EXAM: RETROGRADE PYELOGRAM COMPARISON:  03/07/2020 FINDINGS: The patient has undergone placement of a ureteral stent on the left. Two images were submitted. The first image demonstrates contrast injection into the collecting system with a possible filling defect at the level of the renal pelvis on the left. Final images demonstrate a catheter projecting over the renal pelvis apparent the total fluoroscopy time was 17 seconds. The total dose was 3.42 mGy. IMPRESSION: Ureteral stent placement as detailed above. Please see separate operative report for complete intraoperative details. Electronically Signed   By: Katherine Mantle M.D.   On: 03/07/2020 17:23   CT Renal Stone Study  Result Date: 03/07/2020 CLINICAL DATA:  Left flank pain.  Dry heaves. EXAM: CT ABDOMEN AND PELVIS WITHOUT CONTRAST TECHNIQUE: Multidetector CT imaging of the abdomen and pelvis was performed following the standard protocol without IV contrast. COMPARISON:  None. FINDINGS: Lower chest:  Small sliding hiatal hernia. Hepatobiliary: No focal liver abnormality.No evidence of biliary obstruction or stone. Pancreas: Unremarkable. Spleen: Unremarkable. Adrenals/Urinary Tract: 14 mm low-density right adrenal nodule consistent with adenoma. 6 mm left UPJ calculus with mild hydronephrosis and low-density renal expansion. No additional renal calculi. 17 mm right renal cyst. Unremarkable bladder. Stomach/Bowel: No obstruction. No appendicitis. Left colonic diverticulosis. Vascular/Lymphatic: No acute vascular abnormality. Atheromatous calcification. No mass or adenopathy. Reproductive:No pathologic findings. Other: No ascites or pneumoperitoneum. Musculoskeletal: No acute abnormalities. Lower lumbar spine degeneration. IMPRESSION: 1. Obstructing 6 mm left UPJ calculus. 2. Chronic findings are  described above. Electronically Signed   By: Marnee Spring M.D.   On: 03/07/2020 10:34    Microbiology: Recent Results (from the past 240 hour(s))  Resp Panel by RT-PCR (Flu A&B, Covid) Urine, Clean Catch     Status: None   Collection Time: 03/07/20 10:50 AM   Specimen: Urine, Clean Catch; Nasopharyngeal(NP) swabs in vial transport medium  Result Value Ref Range Status   SARS Coronavirus 2 by RT PCR NEGATIVE NEGATIVE Final    Comment: (NOTE) SARS-CoV-2 target nucleic acids are NOT DETECTED.  The SARS-CoV-2 RNA is generally detectable in upper respiratory specimens during the acute phase of infection. The lowest concentration of SARS-CoV-2 viral copies this assay can detect is 138 copies/mL. A negative result does not preclude SARS-Cov-2 infection and should not be used as the sole basis for treatment or other patient management decisions. A negative result may occur with  improper specimen collection/handling, submission of specimen other than nasopharyngeal swab, presence of viral mutation(s) within the areas targeted by this assay, and inadequate number of viral copies(<138 copies/mL). A negative result must be combined with clinical observations, patient history, and epidemiological information. The expected result is Negative.  Fact Sheet for Patients:  BloggerCourse.com  Fact Sheet for Healthcare Providers:  SeriousBroker.it  This test is no t yet approved or cleared by the [place] FDA and  has been authorized for detection and/or diagnosis of SARS-CoV-2 by FDA under an Emergency Use Authorization (  EUA). This EUA will remain  in effect (meaning this test can be used) for the duration of the COVID-19 declaration under Section 564(b)(1) of the Act, 21 U.S.C.section 360bbb-3(b)(1), unless the authorization is terminated  or revoked sooner.       Influenza A by PCR NEGATIVE NEGATIVE Final   Influenza B by PCR  NEGATIVE NEGATIVE Final    Comment: (NOTE) The Xpert Xpress SARS-CoV-2/FLU/RSV plus assay is intended as an aid in the diagnosis of influenza from Nasopharyngeal swab specimens and should not be used as a sole basis for treatment. Nasal washings and aspirates are unacceptable for Xpert Xpress SARS-CoV-2/FLU/RSV testing.  Fact Sheet for Patients: BloggerCourse.com  Fact Sheet for Healthcare Providers: SeriousBroker.it  This test is not yet approved or cleared by the [place] FDA and has been authorized for detection and/or diagnosis of SARS-CoV-2 by FDA under an Emergency Use Authorization (EUA). This EUA will remain in effect (meaning this test can be used) for the duration of the COVID-19 declaration under Section 564(b)(1) of the Act, 21 U.S.C. section 360bbb-3(b)(1), unless the authorization is terminated or revoked.  Performed at [facility], [address] [place], [place] [address]   Culture, blood (single)     Status: None (Preliminary result)   Collection Time: 03/07/20  2:54 PM   Specimen: BLOOD  Result Value Ref Range Status   Specimen Description BLOOD RIGHT ANTECUBITAL  Final   Special Requests   Final    BOTTLES DRAWN AEROBIC AND ANAEROBIC Blood Culture adequate volume   Culture   Final    NO GROWTH 2 DAYS Performed at [facility], [address], [place], [place] [address]    Report Status PENDING  Incomplete     Labs: Basic Metabolic Panel: Recent Labs  Lab 03/07/20 0842 03/08/20 0025 03/09/20 0622  NA 140 136 139  K 3.9 4.2 3.7  CL 106 104 102  CO2 25 25 29   GLUCOSE 154* 170* 119*  BUN 14 12 9   CREATININE 1.13* 1.06* 0.97  CALCIUM 9.2 8.6* 8.4*  MG  --  1.9  --    Liver Function Tests: No results for input(s): AST, ALT, ALKPHOS, BILITOT, PROT, ALBUMIN in the last 168 hours. No results for input(s): LIPASE, AMYLASE in the last 168 hours. No results for input(s): AMMONIA in  the last 168 hours. CBC: Recent Labs  Lab 03/07/20 0842 03/08/20 0025 03/09/20 0622  WBC 12.3* 14.4* 6.6  NEUTROABS 11.2*  --   --   HGB 14.8 12.9 11.5*  HCT 45.0 38.8 35.8*  MCV 84.9 85.5 87.5  PLT 178 153 125*       Signed:  03/10/20 MD.  [facility] 03/09/2020, 2:29 PM

## 2020-03-09 NOTE — Progress Notes (Signed)
DISCHARGE NOTE HOME Charmelle Soh to be discharged  to home. per MD order. Discussed prescriptions and follow up appointments with the patient. Prescriptions given to patient; medication list explained in detail. Patient verbalized understanding.  Skin clean, dry and intact without evidence of skin break down, no evidence of skin tears noted. IV catheter discontinued intact. Site without signs and symptoms of complications. Dressing and pressure applied. Pt denies pain at the site currently. No complaints noted.  Patient free of lines, drains, and wounds.   An After Visit Summary (AVS) was printed and given to the patient. Patient escorted via wheelchair, and discharged home via private auto.  [place], Kem Kays, RN

## 2020-03-12 LAB — CULTURE, BLOOD (SINGLE)
Culture: NO GROWTH
Special Requests: ADEQUATE

## 2020-03-18 ENCOUNTER — Encounter (HOSPITAL_COMMUNITY): Payer: Self-pay | Admitting: Urology

## 2020-03-20 DIAGNOSIS — N201 Calculus of ureter: Secondary | ICD-10-CM | POA: Diagnosis not present

## 2020-03-20 DIAGNOSIS — R8271 Bacteriuria: Secondary | ICD-10-CM | POA: Diagnosis not present

## 2020-03-22 ENCOUNTER — Other Ambulatory Visit: Payer: Self-pay | Admitting: Urology

## 2020-03-25 DIAGNOSIS — R2231 Localized swelling, mass and lump, right upper limb: Secondary | ICD-10-CM | POA: Diagnosis not present

## 2020-03-25 DIAGNOSIS — R5383 Other fatigue: Secondary | ICD-10-CM | POA: Diagnosis not present

## 2020-03-25 DIAGNOSIS — Z0000 Encounter for general adult medical examination without abnormal findings: Secondary | ICD-10-CM | POA: Diagnosis not present

## 2020-03-25 DIAGNOSIS — Z136 Encounter for screening for cardiovascular disorders: Secondary | ICD-10-CM | POA: Diagnosis not present

## 2020-03-25 DIAGNOSIS — Z23 Encounter for immunization: Secondary | ICD-10-CM | POA: Diagnosis not present

## 2020-03-25 DIAGNOSIS — F329 Major depressive disorder, single episode, unspecified: Secondary | ICD-10-CM | POA: Diagnosis not present

## 2020-03-25 DIAGNOSIS — N200 Calculus of kidney: Secondary | ICD-10-CM | POA: Diagnosis not present

## 2020-03-25 DIAGNOSIS — G4700 Insomnia, unspecified: Secondary | ICD-10-CM | POA: Diagnosis not present

## 2020-04-16 ENCOUNTER — Other Ambulatory Visit: Payer: Self-pay

## 2020-04-16 ENCOUNTER — Encounter (HOSPITAL_BASED_OUTPATIENT_CLINIC_OR_DEPARTMENT_OTHER): Payer: Self-pay | Admitting: Urology

## 2020-04-16 ENCOUNTER — Other Ambulatory Visit (HOSPITAL_COMMUNITY)
Admission: RE | Admit: 2020-04-16 | Discharge: 2020-04-16 | Disposition: A | Discharge status: Home / Self Care | Payer: Medicare HMO | Source: Ambulatory Visit | Attending: Urology | Admitting: Urology

## 2020-04-16 DIAGNOSIS — Z01812 Encounter for preprocedural laboratory examination: Secondary | ICD-10-CM | POA: Diagnosis not present

## 2020-04-16 DIAGNOSIS — Z20822 Contact with and (suspected) exposure to covid-19: Secondary | ICD-10-CM | POA: Diagnosis not present

## 2020-04-16 LAB — SARS CORONAVIRUS 2 (TAT 6-24 HRS): SARS Coronavirus 2: NEGATIVE

## 2020-04-16 NOTE — Progress Notes (Signed)
Spoke w/ via phone for pre-op interview---pt  Lab needs dos----  I stat   (gent)          Lab results------none recent COVID test ------04-16-2020 930 Arrive at -------700 am 04-19-2020 NPO after MN NO Solid Food.  Clear liquids from MN until---600 am then npo Med rec completed Medications to take morning of surgery -----bupropion Diabetic medication -----n/a Patient instructed to bring photo id and insurance card day of surgery Patient aware to have Driver (ride ) / caregiver  Thayer Ohm spouse   for 24 hours after surgery  Patient Special Instructions -----none Pre-Op special Istructions -----none Patient verbalized understanding of instructions that were given at this phone interview. Patient denies shortness of breath, chest pain, fever, cough at this phone interview.  Ring stuck left 4th finger and right middle finger, pt will try to remove

## 2020-04-19 ENCOUNTER — Encounter (HOSPITAL_BASED_OUTPATIENT_CLINIC_OR_DEPARTMENT_OTHER)
Admission: RE | Disposition: A | Discharge status: Home / Self Care | Payer: Self-pay | Source: Home / Self Care | Attending: Urology

## 2020-04-19 ENCOUNTER — Other Ambulatory Visit: Payer: Self-pay

## 2020-04-19 ENCOUNTER — Ambulatory Visit (HOSPITAL_BASED_OUTPATIENT_CLINIC_OR_DEPARTMENT_OTHER)
Admission: RE | Admit: 2020-04-19 | Discharge: 2020-04-19 | Disposition: A | Discharge status: Home / Self Care | Payer: Medicare HMO | Attending: Urology | Admitting: Urology

## 2020-04-19 ENCOUNTER — Ambulatory Visit (HOSPITAL_BASED_OUTPATIENT_CLINIC_OR_DEPARTMENT_OTHER): Payer: Medicare HMO | Admitting: Anesthesiology

## 2020-04-19 ENCOUNTER — Encounter (HOSPITAL_BASED_OUTPATIENT_CLINIC_OR_DEPARTMENT_OTHER): Payer: Self-pay | Admitting: Urology

## 2020-04-19 DIAGNOSIS — A419 Sepsis, unspecified organism: Secondary | ICD-10-CM | POA: Diagnosis not present

## 2020-04-19 DIAGNOSIS — N135 Crossing vessel and stricture of ureter without hydronephrosis: Secondary | ICD-10-CM

## 2020-04-19 DIAGNOSIS — N201 Calculus of ureter: Secondary | ICD-10-CM | POA: Insufficient documentation

## 2020-04-19 DIAGNOSIS — Z79899 Other long term (current) drug therapy: Secondary | ICD-10-CM | POA: Diagnosis not present

## 2020-04-19 DIAGNOSIS — Z87891 Personal history of nicotine dependence: Secondary | ICD-10-CM | POA: Diagnosis not present

## 2020-04-19 DIAGNOSIS — Z881 Allergy status to other antibiotic agents status: Secondary | ICD-10-CM | POA: Diagnosis not present

## 2020-04-19 DIAGNOSIS — F418 Other specified anxiety disorders: Secondary | ICD-10-CM | POA: Diagnosis not present

## 2020-04-19 DIAGNOSIS — N202 Calculus of kidney with calculus of ureter: Secondary | ICD-10-CM | POA: Diagnosis not present

## 2020-04-19 HISTORY — DX: Sepsis, unspecified organism: A41.9

## 2020-04-19 HISTORY — PX: CYSTOSCOPY/URETEROSCOPY/HOLMIUM LASER/STENT PLACEMENT: SHX6546

## 2020-04-19 HISTORY — DX: Unspecified osteoarthritis, unspecified site: M19.90

## 2020-04-19 HISTORY — DX: Personal history of urinary calculi: Z87.442

## 2020-04-19 HISTORY — DX: Frequency of micturition: R35.0

## 2020-04-19 HISTORY — DX: Presence of spectacles and contact lenses: Z97.3

## 2020-04-19 LAB — POCT I-STAT, CHEM 8
BUN: 11 mg/dL (ref 8–23)
Calcium, Ion: 1.28 mmol/L (ref 1.15–1.40)
Chloride: 101 mmol/L (ref 98–111)
Creatinine, Ser: 1 mg/dL (ref 0.44–1.00)
Glucose, Bld: 152 mg/dL — ABNORMAL HIGH (ref 70–99)
HCT: 36 % (ref 36.0–46.0)
Hemoglobin: 12.2 g/dL (ref 12.0–15.0)
Potassium: 4.2 mmol/L (ref 3.5–5.1)
Sodium: 143 mmol/L (ref 135–145)
TCO2: 30 mmol/L (ref 22–32)

## 2020-04-19 SURGERY — CYSTOSCOPY/URETEROSCOPY/HOLMIUM LASER/STENT PLACEMENT
Anesthesia: General | Site: Ureter | Laterality: Left

## 2020-04-19 MED ORDER — OXYCODONE HCL 5 MG PO TABS
5.0000 mg | ORAL_TABLET | ORAL | Status: DC | PRN
  Administered 2020-04-19: 5 mg via ORAL

## 2020-04-19 MED ORDER — FENTANYL CITRATE (PF) 100 MCG/2ML IJ SOLN
INTRAMUSCULAR | Status: AC
  Filled 2020-04-19: qty 2

## 2020-04-19 MED ORDER — LIDOCAINE 2% (20 MG/ML) 5 ML SYRINGE
INTRAMUSCULAR | Status: AC
  Filled 2020-04-19: qty 5

## 2020-04-19 MED ORDER — DEXAMETHASONE SODIUM PHOSPHATE 10 MG/ML IJ SOLN
INTRAMUSCULAR | Status: DC | PRN
  Administered 2020-04-19: 10 mg via INTRAVENOUS

## 2020-04-19 MED ORDER — DEXAMETHASONE SODIUM PHOSPHATE 10 MG/ML IJ SOLN
INTRAMUSCULAR | Status: AC
  Filled 2020-04-19: qty 1

## 2020-04-19 MED ORDER — LACTATED RINGERS IV SOLN: INTRAVENOUS | Status: DC

## 2020-04-19 MED ORDER — KETOROLAC TROMETHAMINE 30 MG/ML IJ SOLN
INTRAMUSCULAR | Status: AC
  Filled 2020-04-19: qty 1

## 2020-04-19 MED ORDER — 0.9 % SODIUM CHLORIDE (POUR BTL) OPTIME
TOPICAL | Status: DC | PRN
  Administered 2020-04-19: 500 mL

## 2020-04-19 MED ORDER — PHENYLEPHRINE 40 MCG/ML (10ML) SYRINGE FOR IV PUSH (FOR BLOOD PRESSURE SUPPORT)
PREFILLED_SYRINGE | INTRAVENOUS | Status: AC
  Filled 2020-04-19: qty 10

## 2020-04-19 MED ORDER — FENTANYL CITRATE (PF) 100 MCG/2ML IJ SOLN: 25.0000 ug | INTRAMUSCULAR | Status: DC | PRN

## 2020-04-19 MED ORDER — OXYCODONE-ACETAMINOPHEN 5-325 MG PO TABS: 1.0000 | ORAL_TABLET | ORAL | 0 refills | Status: DC | PRN

## 2020-04-19 MED ORDER — GENTAMICIN SULFATE 40 MG/ML IJ SOLN
5.0000 mg/kg | INTRAVENOUS | Status: AC
  Administered 2020-04-19: 320 mg via INTRAVENOUS
  Filled 2020-04-19: qty 8

## 2020-04-19 MED ORDER — PROPOFOL 10 MG/ML IV BOLUS
INTRAVENOUS | Status: DC | PRN
Start: 2020-04-19 — End: 2020-04-19
  Administered 2020-04-19: 30 mg via INTRAVENOUS
  Administered 2020-04-19: 20 mg via INTRAVENOUS
  Administered 2020-04-19: 150 mg via INTRAVENOUS

## 2020-04-19 MED ORDER — CEPHALEXIN 500 MG PO CAPS: 500.0000 mg | ORAL_CAPSULE | Freq: Two times a day (BID) | ORAL | 0 refills | Status: AC

## 2020-04-19 MED ORDER — PROMETHAZINE HCL 25 MG/ML IJ SOLN: 6.2500 mg | INTRAMUSCULAR | Status: DC | PRN

## 2020-04-19 MED ORDER — ONDANSETRON HCL 4 MG/2ML IJ SOLN
INTRAMUSCULAR | Status: DC | PRN
  Administered 2020-04-19: 4 mg via INTRAVENOUS

## 2020-04-19 MED ORDER — ONDANSETRON HCL 4 MG/2ML IJ SOLN
INTRAMUSCULAR | Status: AC
  Filled 2020-04-19: qty 2

## 2020-04-19 MED ORDER — LIDOCAINE 2% (20 MG/ML) 5 ML SYRINGE
INTRAMUSCULAR | Status: DC | PRN
  Administered 2020-04-19: 40 mg via INTRAVENOUS
  Administered 2020-04-19: 60 mg via INTRAVENOUS

## 2020-04-19 MED ORDER — ACETAMINOPHEN 10 MG/ML IV SOLN
1000.0000 mg | Freq: Once | INTRAVENOUS | Status: DC | PRN
Start: 2020-04-19 — End: 2020-04-19

## 2020-04-19 MED ORDER — DOCUSATE SODIUM 100 MG PO CAPS: 100.0000 mg | ORAL_CAPSULE | Freq: Every day | ORAL | 0 refills | Status: AC | PRN

## 2020-04-19 MED ORDER — FENTANYL CITRATE (PF) 100 MCG/2ML IJ SOLN
INTRAMUSCULAR | Status: DC | PRN
  Administered 2020-04-19 (×3): 25 ug via INTRAVENOUS
  Administered 2020-04-19: 50 ug via INTRAVENOUS

## 2020-04-19 MED ORDER — OXYCODONE HCL 5 MG PO TABS
ORAL_TABLET | ORAL | Status: AC
  Filled 2020-04-19: qty 1

## 2020-04-19 MED ORDER — EPHEDRINE SULFATE-NACL 50-0.9 MG/10ML-% IV SOSY
PREFILLED_SYRINGE | INTRAVENOUS | Status: DC | PRN
  Administered 2020-04-19 (×4): 10 mg via INTRAVENOUS

## 2020-04-19 MED ORDER — PROPOFOL 10 MG/ML IV BOLUS
INTRAVENOUS | Status: AC
  Filled 2020-04-19: qty 20

## 2020-04-19 MED ORDER — MIDAZOLAM HCL 2 MG/2ML IJ SOLN
INTRAMUSCULAR | Status: DC | PRN
  Administered 2020-04-19: 2 mg via INTRAVENOUS

## 2020-04-19 MED ORDER — MIDAZOLAM HCL 2 MG/2ML IJ SOLN
INTRAMUSCULAR | Status: AC
  Filled 2020-04-19: qty 2

## 2020-04-19 MED ORDER — IOHEXOL 300 MG/ML  SOLN
INTRAMUSCULAR | Status: DC | PRN
  Administered 2020-04-19: 14 mL via URETHRAL

## 2020-04-19 MED ORDER — KETOROLAC TROMETHAMINE 30 MG/ML IJ SOLN
INTRAMUSCULAR | Status: DC | PRN
  Administered 2020-04-19: 30 mg via INTRAVENOUS

## 2020-04-19 SURGICAL SUPPLY — 26 items
BAG DRAIN URO-CYSTO SKYTR STRL (DRAIN) ×2 IMPLANT
BAG DRN UROCATH (DRAIN) ×1
BASKET ZERO TIP NITINOL 2.4FR (BASKET) ×2 IMPLANT
BSKT STON RTRVL ZERO TP 2.4FR (BASKET) ×1
CATH SET URETHRAL DILATOR (CATHETERS) IMPLANT
CATH URET 5FR 28IN OPEN ENDED (CATHETERS) ×2 IMPLANT
CLOTH BEACON ORANGE TIMEOUT ST (SAFETY) ×2 IMPLANT
FIBER LASER FLEXIVA 365 (UROLOGICAL SUPPLIES) IMPLANT
GLOVE SURG ENC MOIS LTX SZ7 (GLOVE) ×2 IMPLANT
GOWN STRL REUS W/TWL LRG LVL3 (GOWN DISPOSABLE) ×2 IMPLANT
GUIDEWIRE STR DUAL SENSOR (WIRE) ×2 IMPLANT
GUIDEWIRE ZIPWRE .038 STRAIGHT (WIRE) ×2 IMPLANT
IV NS 1000ML (IV SOLUTION) ×2
IV NS 1000ML BAXH (IV SOLUTION) ×1 IMPLANT
IV NS IRRIG 3000ML ARTHROMATIC (IV SOLUTION) ×4 IMPLANT
KIT TURNOVER CYSTO (KITS) ×2 IMPLANT
MANIFOLD NEPTUNE II (INSTRUMENTS) ×2 IMPLANT
NS IRRIG 500ML POUR BTL (IV SOLUTION) ×2 IMPLANT
PACK CYSTO (CUSTOM PROCEDURE TRAY) ×2 IMPLANT
STENT CONTOUR 7FRX24 (STENTS) ×2 IMPLANT
SYR 10ML LL (SYRINGE) ×2 IMPLANT
TRACTIP FLEXIVA PULS ID 200XHI (Laser) ×1 IMPLANT
TRACTIP FLEXIVA PULSE ID 200 (Laser) ×2
TUBE CONNECTING 12X1/4 (SUCTIONS) ×2 IMPLANT
TUBE FEEDING 8FR 16IN STR KANG (MISCELLANEOUS) IMPLANT
TUBING UROLOGY SET (TUBING) ×2 IMPLANT

## 2020-04-19 NOTE — Anesthesia Preprocedure Evaluation (Signed)
Anesthesia Evaluation  Patient identified by MRN, date of birth, ID band Patient awake    Reviewed: Allergy & Precautions, NPO status , Patient's Chart, lab work & pertinent test results  Airway Mallampati: II  TM Distance: >3 FB Neck ROM: Full    Dental  (+) Teeth Intact   Pulmonary neg pulmonary ROS, former smoker,    Pulmonary exam normal        Cardiovascular negative cardio ROS   Rhythm:Regular Rate:Normal     Neuro/Psych Anxiety Depression Cerebral aneurysm s/p coiling 15 years ago    GI/Hepatic negative GI ROS, Neg liver ROS,   Endo/Other  negative endocrine ROS  Renal/GU Renal diseaseLeft ureteral calculi  negative genitourinary   Musculoskeletal  (+) Arthritis , Osteoarthritis,    Abdominal (+)  Abdomen: soft. Bowel sounds: normal.  Peds  Hematology negative hematology ROS (+)   Anesthesia Other Findings   Reproductive/Obstetrics                            Anesthesia Physical Anesthesia Plan  ASA: II  Anesthesia Plan: General   Post-op Pain Management:    Induction: Intravenous  PONV Risk Score and Plan: 3 and Ondansetron, Dexamethasone, Midazolam and Treatment may vary due to age or medical condition  Airway Management Planned: Mask and LMA  Additional Equipment: None  Intra-op Plan:   Post-operative Plan: Extubation in OR  Informed Consent: I have reviewed the patients History and Physical, chart, labs and discussed the procedure including the risks, benefits and alternatives for the proposed anesthesia with the patient or authorized representative who has indicated his/her understanding and acceptance.     Dental advisory given  Plan Discussed with: CRNA  Anesthesia Plan Comments: (Lab Results      Component                Value               Date                      NA                       143                 04/19/2020                K                         4.2                 04/19/2020                CO2                      29                  03/09/2020                GLUCOSE                  152 (H)             04/19/2020                BUN  11                  04/19/2020                CREATININE               1.00                04/19/2020                CALCIUM                  8.4 (L)             03/09/2020                GFRNONAA                 >60                 03/09/2020                GFRAA                    >60                 12/22/2018          )        Anesthesia Quick Evaluation

## 2020-04-19 NOTE — Transfer of Care (Signed)
Immediate Anesthesia Transfer of Care Note  Patient: Karla Watson  Procedure(s) Performed: Procedure(s) (LRB): CYSTOSCOPY/RETROGRADE/URETEROSCOPY/HOLMIUM LASER/STENT PLACEMENT (Left)  Patient Location: PACU  Anesthesia Type: General  Level of Consciousness: sleepy  Airway & Oxygen Therapy: Patient Spontanous Breathing and Patient connected to face mask oxygen, 9.0 oral airway in place.  Post-op Assessment: Report given to PACU RN and Post -op Vital signs reviewed and stable  Post vital signs: Reviewed and stable  Complications: No apparent anesthesia complicationsLast Vitals:  Vitals Value Taken Time  BP 120/60 04/19/20 1015  Temp 36.3 C 04/19/20 1009  Pulse 73 04/19/20 1016  Resp 18 04/19/20 1016  SpO2 92 % 04/19/20 1016  Vitals shown include unvalidated device data.  Last Pain:  Vitals:   04/19/20 0727  TempSrc: Oral         Complications: No complications documented.

## 2020-04-19 NOTE — H&P (Signed)
Office Visit Report     03/20/2020   --------------------------------------------------------------------------------   Annye Forrey  MRN: [id number]  DOB: [birth year], 70 year old Female  SSN:    PRIMARY CARE:    REFERRING:    PROVIDER:  Jettie Pagan, M.D.  TREATING:  Bartholomew Crews, NP  LOCATION:  [facility] [phone]     --------------------------------------------------------------------------------   CC/HPI: cc: Left obstructing ureteral stone   HPI: 70 year old female who underwent emergent stent placement due to urosepsis after diagnosis of a 8 mm left ureteral stone. The stent was placed on 03/07/2020 and she was treated in the hospital with IV antibiotics and discharged. Today, she reports that she feels much improved. She is here to discuss definitive stone intervention. She denies fevers and chills. She does report some stent discomfort but this is not bothersome. She does have some irritative voiding symptoms including frequency, nocturia and blood-tinged urine but this is to be expected due to the stents. She has not needed any medications to control the symptoms and is tolerating everything well.     ALLERGIES: Erythromycin    MEDICATIONS: Bupropion Xl  Fluoxetine Hcl  Trazodone Hcl  Tylenol     GU PSH: Cystoscopy Insert Stent, Left - 03/07/2020     NON-GU PSH: Brain Aneurysm Repr; Simple - 2006     GU PMH: None   NON-GU PMH: Anxiety Depression GERD    FAMILY HISTORY: 1 Daughter - Runs in Family Breast Cancer - Mother Prostate Cancer - Father Son 1-1 Deceased - Runs in Family   SOCIAL HISTORY: Marital Status: Married Ethnicity: Not Hispanic Or Latino; Race: White Current Smoking Status: Patient does not smoke anymore. Has not smoked since 03/20/2002. Smoked for 25 years. Smoked 1 pack per day.   Tobacco Use Assessment Completed: Used Tobacco in last 30 days? Has never drank.  Drinks 2 caffeinated drinks per day.    REVIEW OF  SYSTEMS:    GU Review Female:   Patient reports frequent urination, get up at night to urinate, and leakage of urine. Patient denies hard to postpone urination, burning /pain with urination, stream starts and stops, trouble starting your stream, have to strain to urinate, and being pregnant.  Gastrointestinal (Upper):   Patient reports nausea, vomiting, and indigestion/ heartburn.   Gastrointestinal (Lower):   Patient denies diarrhea and constipation.  Constitutional:   Patient reports fatigue. Patient denies fever, night sweats, and weight loss.  Skin:   Patient denies skin rash/ lesion and itching.  Eyes:   Patient denies blurred vision and double vision.  Ears/ Nose/ Throat:   Patient denies sore throat and sinus problems.  Hematologic/Lymphatic:   Patient reports easy bruising. Patient denies swollen glands.  Cardiovascular:   Patient denies leg swelling and chest pains.  Respiratory:   Patient denies cough and shortness of breath.  Endocrine:   Patient denies excessive thirst.  Musculoskeletal:   Patient denies back pain and joint pain.  Neurological:   Patient reports dizziness and headaches.   Psychologic:   Patient reports depression and anxiety.    VITAL SIGNS:      03/20/2020 09:39 AM  Weight 175 lb / 79.38 kg  Height 63 in / 160.02 cm  BP 149/79 mmHg  Pulse 61 /min  Temperature 96.8 F / 36 C  BMI 31.0 kg/m   MULTI-SYSTEM PHYSICAL EXAMINATION:    Constitutional: Well-nourished. No physical deformities. Normally developed. Good grooming.  Respiratory: No labored breathing, no use of accessory muscles.   Cardiovascular:  Normal temperature, normal extremity pulses, no swelling, no varicosities.  Skin: No paleness, no jaundice, no cyanosis. No lesion, no ulcer, no rash.  Gastrointestinal: No mass, no tenderness, no rigidity, non obese abdomen.   Musculoskeletal: Normal gait and station of head and neck.     Complexity of Data:  Source Of History:  Patient, Medical Record  Summary  Records Review:   Previous Doctor Records, Previous Hospital Records, Previous Patient Records  Urine Test Review:   Urinalysis, Urine Culture  X-Ray Review: C.T. Abdomen/Pelvis: Reviewed Films. Reviewed Report.     03/20/20  Urinalysis  Urine Appearance Cloudy   Urine Color Red   Urine Glucose Neg mg/dL  Urine Bilirubin Neg mg/dL  Urine Ketones Trace mg/dL  Urine Specific Gravity 1.020   Urine Blood 3+ ery/uL  Urine pH 5.5   Urine Protein 3+ mg/dL  Urine Urobilinogen 0.2 mg/dL  Urine Nitrites Neg   Urine Leukocyte Esterase 2+ leu/uL  Urine WBC/hpf 0 - 5/hpf   Urine RBC/hpf >60/hpf   Urine Epithelial Cells 0 - 5/hpf   Urine Bacteria Many (>50/hpf)   Urine Mucous Not Present   Urine Yeast NS (Not Seen)   Urine Trichomonas Not Present   Urine Cystals NS (Not Seen)   Urine Casts NS (Not Seen)   Urine Sperm Not Present    PROCEDURES:          Urinalysis w/Scope - 81001 Dipstick Dipstick Cont'd Micro  Color: Red Bilirubin: Neg WBC/hpf: 0 - 5/hpf  Appearance: Cloudy Ketones: Trace RBC/hpf: >60/hpf  Specific Gravity: 1.020 Blood: 3+ Bacteria: Many (>50/hpf)  pH: 5.5 Protein: 3+ Cystals: NS (Not Seen)  Glucose: Neg Urobilinogen: 0.2 Casts: NS (Not Seen)    Nitrites: Neg Trichomonas: Not Present    Leukocyte Esterase: 2+ Mucous: Not Present      Epithelial Cells: 0 - 5/hpf      Yeast: NS (Not Seen)      Sperm: Not Present    Notes:  Unspun micro due to color/quantity     ASSESSMENT:      ICD-10 Details  1 GU:   Ureteral calculus - N20.1 Left, Acute, Systemic Symptoms   PLAN:           Orders Labs CULTURE, URINE          Schedule Return Visit/Planned Activity: Next Available Appointment - Schedule Surgery          Document Letter(s):  Created for Patient: Clinical Summary         Notes:   Urine sent for culture today. I will follow up with her regarding these results. We discussed definitive stone management including ESWL as well as ureteroscopy.  She would like to proceed with ureteroscopy. The risks of ureteroscopy were discussed including the general risks of anesthesia, risk of bleeding, infection and risk of injury to surrounding structures. She verbalized her understanding to these risks. She understands she will most likely have a ureteral stent in place after the procedure and this will need to be removed at the surgeon's discretion. She understands she may call the office for any questions or concerns. Surgical scheduling sheet was handed to scheduler.     Signed by Bartholomew Crews, NP on 03/21/20 at 8:16 PM (EST)  Urology Preoperative H&P   Chief Complaint: L UPJ stone  History of Present Illness: Laura Caldas is a 70 y.o. female with a 42mm LUPJ stone who initially presented on 03/07/2020 with signs of infection. She underwent left stent placement and  was appropriately treated with a course of antibiotics. She presents today for definitive management of her left UPJ stone. She denies fevers, chills, dysuria.  Past Medical History:  Diagnosis Date  . Arthritis    oa hands and left foot  . Depression   . History of kidney stones   . Insomnia   . Kidney abscess 2012  . Pleurisy 12/2018   tx with antibiotics  . Sepsis (HCC)    in [place] from 03-07-2020 to 03-09-2020  . Urinary frequency   . Vertigo occ  . Wears glasses     Past Surgical History:  Procedure Laterality Date  . CATARACT EXTRACTION Bilateral yrs ago  . CEREBRAL ANEURYSM REPAIR  15 yrs ago   no problems after coiling done  . CYSTOSCOPY W/ URETERAL STENT PLACEMENT Left 03/07/2020   Procedure: CYSTOSCOPY WITH RETROGRADE PYELOGRAM/URETERAL STENT PLACEMENT;  Surgeon: Jannifer HickGay, Yaritza Leist R, MD;  Location: [facility] OR;  Service: Urology;  Laterality: Left;  . TUBAL LIGATION  yrs ago    Allergies:  Allergies  Allergen Reactions  . Erythromycin Nausea And Vomiting    Family History  Problem Relation Age of Onset  . Breast cancer Mother   . Prostate cancer Father      Social History:  reports that she quit smoking about 18 years ago. Her smoking use included cigarettes. She has a 30.00 pack-year smoking history. She has never used smokeless tobacco. She reports previous alcohol use. She reports that she does not use drugs.  ROS: A complete review of systems was performed.  All systems are negative except for pertinent findings as noted.  Physical Exam:  Vital signs in last 24 hours:   Constitutional:  Alert and oriented, No acute distress Cardiovascular: Regular rate and rhythm Respiratory: Normal respiratory effort, Lungs clear bilaterally GI: Abdomen is soft, nontender, nondistended, no abdominal masses GU: No CVA tenderness Lymphatic: No lymphadenopathy Neurologic: Grossly intact, no focal deficits Psychiatric: Normal mood and affect  Laboratory Data:  No results for input(s): WBC, HGB, HCT, PLT in the last 72 hours.  No results for input(s): NA, K, CL, GLUCOSE, BUN, CALCIUM, CREATININE in the last 72 hours.  Invalid input(s): CO3   No results found for this or any previous visit (from the past 24 hour(s)). Recent Results (from the past 240 hour(s))  SARS CORONAVIRUS 2 (TAT 6-24 HRS) Nasopharyngeal Nasopharyngeal Swab     Status: None   Collection Time: 04/16/20  9:46 AM   Specimen: Nasopharyngeal Swab  Result Value Ref Range Status   SARS Coronavirus 2 NEGATIVE NEGATIVE Final    Comment: (NOTE) SARS-CoV-2 target nucleic acids are NOT DETECTED.  The SARS-CoV-2 RNA is generally detectable in upper and lower respiratory specimens during the acute phase of infection. Negative results do not preclude SARS-CoV-2 infection, do not rule out co-infections with other pathogens, and should not be used as the sole basis for treatment or other patient management decisions. Negative results must be combined with clinical observations, patient history, and epidemiological information. The expected result is Negative.  Fact Sheet for  Patients: HairSlick.nohttps://www.fda.gov/media/138098/download  Fact Sheet for Healthcare Providers: quierodirigir.comhttps://www.fda.gov/media/138095/download  This test is not yet approved or cleared by the [place] FDA and  has been authorized for detection and/or diagnosis of SARS-CoV-2 by FDA under an Emergency Use Authorization (EUA). This EUA will remain  in effect (meaning this test can be used) for the duration of the COVID-19 declaration under Se ction 564(b)(1) of the Act, 21 U.S.C. section 360bbb-3(b)(1), unless the  authorization is terminated or revoked sooner.  Performed at [facility], [address], [place], [place] [address]     Renal Function: No results for input(s): CREATININE in the last 168 hours. CrCl cannot be calculated (Patient's most recent lab result is older than the maximum 21 days allowed.).  Radiologic Imaging: No results found.  I independently reviewed the above imaging studies.  Assessment and Plan Debe Anfinson is a 70 y.o. female with 6mm LUPJ stone who initially presented on 03/07/2020 with signs of infection. She underwent left stent placement and was appropriately treated with a course of antibiotics. She presents today for definitive management of her left UPJ stone. She denies fevers, chills, dysuria.  -The risks, benefits and alternatives of cystoscopy with L URS/LL, left JJ stent placement was discussed with the patient.  Risks include, but are not limited to: bleeding, urinary tract infection, ureteral injury, ureteral stricture disease, chronic pain, urinary symptoms, bladder injury, stent migration, the need for nephrostomy tube placement, MI, CVA, DVT, PE and the inherent risks with general anesthesia.  The patient voices understanding and wishes to proceed.    Matt R. Tonnie Friedel MD 04/19/2020, 7:27 AM  [facility] Pager: [phone]): [phone]

## 2020-04-19 NOTE — Progress Notes (Signed)
Assumed care from [place], [place]. Ambulated to Bathroom and voided without difficulty, placed in recliner, sipping water. Spouse at bedside.

## 2020-04-19 NOTE — Op Note (Addendum)
Operative Note  Preoperative diagnosis:  1.  Left UPJ stone  Postoperative diagnosis: 1.  Left UPJ stone  Procedure(s): 1.  Cystoscopy 2. Left retrograde pyelogram 3. Left ureteroscopy with laser lithotripsy 4.  Left ureteral stent exchange 5.  Fluoroscopy less than 1 hour with intraoperative interpretation  Surgeon: Jettie Pagan, MD  Assistants:  None  Anesthesia:  General  Complications:  None  EBL: Minimal  Specimens: 1.  None  Drains/Catheters: 1.  7 French by 24 cm left ureteral stent without a string  Intraoperative findings:   1. Approximately 6 mm left renal stone successfully dusted into tiny pieces. 2. Evidence of well-formed old clearing clot and sloughing of mucosal tissue in the left collecting system.  No evidence of any suspicious papillary lesions or concern for malignancy. 3. Successful left ureteral stent exchange  Indication:  Karla Watson is a 70 y.o. female with history of a 6 mm left ureteropelvic junction stone.  She presented with signs of infection on 03/07/2020 underwent left ureteral stent placement that time.  She was treated appropriately with a course of IV antibiotics.  She follows up today for definitive treatment of her left UPJ stone.  Description of procedure: After informed consent was obtained from the patient, the patient was identified and taken to the operating room and placed in the supine position.  General anesthesia was administered as well as perioperative IV antibiotics.  At the beginning of the case, a time-out was performed to properly identify the patient, the surgery to be performed, and the surgical site.  Sequential compression devices were applied to the lower extremities at the beginning of the case for DVT prophylaxis.  The patient was then placed in the dorsal lithotomy supine position, prepped and draped in sterile fashion.  Preliminary scout fluoroscopy revealed that there was a 6 mm calcification area at the left  UPJ, which corresponds to the stone found on the preoperative CT scan. We then passed the 21-French rigid cystoscope through the urethra and into the bladder under vision without any difficulty , noting a normal urethra without strictures.  A systematic evaluation of the bladder revealed no evidence of any suspicious bladder lesions.  Ureteral orifices were in normal position.    The distal aspect of the ureteral stent was seen protruding from the left ureteral orifice.  We then used the alligator-tooth forceps and grasped the distal end of the ureteral stent and brought it out the urethral meatus while watching the proximal coil straighten out nicely on fluoroscopy. Through the ureteral stent, we then passed a 0.038 sensor wire up to the level of the renal pelvis.  The ureteral stent was then removed, leaving the sensor wire up the left ureter.    A semi-rigid ureteroscope was passed alongside the wire up the distal ureter which appeared normal.  There was some evidence of old clot within the left ureter and sloughing of mucosal tissue.  There is no concern for any suspicious lesions.  A second 0.038 zip wire was passed under direct vision and the semirigid scope was removed. The flexible ureteroscope was advanced into the collecting system over the wire. The collecting system was inspected. The calculus was identified at the left lower pole. Using the 200 micron holmium laser fiber, the stone was dusted completely.  All the fragments were less than the tip of the basket and were unable to be basket extracted. With the ureteroscope in the kidney, a gentle pyelogram was performed to delineate the calyceal system and  we evaluated the calyces systematically. We encountered no further large stone fragments. The rest of the stone fragments were very tiny and these were  irrigated away gently. The calyces were re-inspected and there were no significant stone fragment residual.   We then withdrew the ureteroscope  back down the ureter along with the access sheath, noting no evidence of any stones along the course of the ureter.  Prior to removing the ureteroscope, we did pass the Glidewire back up to the ureter to the renal pelvis.    Once the ureteroscope was removed, the Glidewire was backloaded through the rigid cystoscope, which was then advanced down the urethra and into the bladder. We then used the Glidewire under direct vision through the rigid cystoscope and under fluoroscopic guidance and passed up a 7-French, 24 cm double-pigtail ureteral stent up ureter, making sure that the proximal and distal ends coiled within the kidney and bladder respectively.  The cystoscope was then advanced back into the bladder under vision.  We were able to see the distal stent coiling nicely within the bladder.  The bladder was then emptied with irrigation solution.  The cystoscope was then removed.    The patient tolerated the procedure well and there was no complication. Patient was awoken from anesthesia and taken to the recovery room in stable condition. I was present and scrubbed for the entirety of the case.  Plan:  Patient will be discharged home.  She will follow-up in the office in 7 days for cystoscopy and left ureteral stent removal in the office.  She be discharged home on a course of antibiotics, pain control.  The findings were discussed with her husband.  Matt R. Bergen Melle MD [facility]  Pager: [phone]

## 2020-04-19 NOTE — Anesthesia Postprocedure Evaluation (Signed)
Anesthesia Post Note  Patient: Karla Watson  Procedure(s) Performed: CYSTOSCOPY/RETROGRADE/URETEROSCOPY/HOLMIUM LASER/STENT PLACEMENT (Left Ureter)     Patient location during evaluation: PACU Anesthesia Type: General Level of consciousness: awake and alert Pain management: pain level controlled Vital Signs Assessment: post-procedure vital signs reviewed and stable Respiratory status: spontaneous breathing, nonlabored ventilation, respiratory function stable and patient connected to nasal cannula oxygen Cardiovascular status: blood pressure returned to baseline and stable Postop Assessment: no apparent nausea or vomiting Anesthetic complications: no   No complications documented.  Last Vitals:  Vitals:   04/19/20 1055 04/19/20 1129  BP: (!) 178/75 (!) 178/71  Pulse: 77 72  Resp: (!) 23 14  Temp:  36.5 C  SpO2: 93% 96%    Last Pain:  Vitals:   04/19/20 1105  TempSrc:   PainSc: 3                  Cyrah Mclamb P Shaterria Sager

## 2020-04-19 NOTE — Discharge Instructions (Signed)
[facility] [phone] Post Ureteroscopy With Stent Instructions  Definitions:  Ureter: The duct that transports urine from the kidney to the bladder. Stent:   A plastic hollow tube that is placed into the ureter, from the kidney to the bladder to prevent the ureter from swelling shut.  GENERAL INSTRUCTIONS:  Despite the fact that no skin incisions were used, the area around the ureter and bladder is raw and irritated. The stent is a foreign body which will further irritate the bladder wall. This irritation is manifested by increased frequency of urination, both day and night, and by an increase in the urge to urinate. In some, the urge to urinate is present almost always. Sometimes the urge is strong enough that you may not be able to stop yourself from urinating. The only real cure is to remove the stent and then give time for the bladder wall to heal which can't be done until the danger of the ureter swelling shut has passed, which varies.  You may see some blood in your urine while the stent is in place and a few days afterwards. Do not be alarmed, even if the urine was clear for a while. Get off your feet and drink lots of fluids until clearing occurs. If you start to pass clots or don't improve, call us.  DIET: You may return to your normal diet immediately. Because of the raw surface of your bladder, alcohol, spicy foods, acid type foods and drinks with caffeine may cause irritation or frequency and should be used in moderation. To keep your urine flowing freely and to avoid constipation, drink plenty of fluids during the day ( 8-10 glasses ). Tip: Avoid cranberry juice because it is very acidic.  ACTIVITY: Your physical activity doesn't need to be restricted. However, if you are very active, you may see some blood in your urine. We suggest that you reduce your activity under these circumstances until the bleeding has stopped.  BOWELS: It is important to keep your  bowels regular during the postoperative period. Straining with bowel movements can cause bleeding. A bowel movement every other day is reasonable. Use a mild laxative if needed, such as Milk of Magnesia 2-3 tablespoons, or 2 Dulcolax tablets. Call if you continue to have problems. If you have been taking narcotics for pain, before, during or after your surgery, you may be constipated. Take a laxative if necessary.   MEDICATION: You should resume your pre-surgery medications unless told not to. In addition you will often be given an antibiotic to prevent infection. These should be taken as prescribed until the bottles are finished unless you are having an unusual reaction to one of the drugs.  PROBLEMS YOU SHOULD REPORT TO [place]:  Fevers over 100.5 Fahrenheit.  Heavy bleeding, or clots ( See above notes about blood in urine ).  Inability to urinate.  Drug reactions ( hives, rash, nausea, vomiting, diarrhea ).  Severe burning or pain with urination that is not improving.  FOLLOW-UP: You will need a follow-up appointment to monitor your progress. Call for this appointment at the number listed above. Usually the first appointment will be about three to fourteen days after your surgery.   Post Anesthesia Home Care Instructions  Activity: Get plenty of rest for the remainder of the day. A responsible individual must stay with you for 24 hours following the procedure.  For the next 24 hours, DO NOT: -Drive a car -Advertising copywriter -Drink alcoholic beverages -Take any medication unless instructed by  your physician -Make any legal decisions or sign important papers.  Meals: Start with liquid foods such as gelatin or soup. Progress to regular foods as tolerated. Avoid greasy, spicy, heavy foods. If nausea and/or vomiting occur, drink only clear liquids until the nausea and/or vomiting subsides. Call your physician if vomiting continues.  Special Instructions/Symptoms: Your throat may feel dry  or sore from the anesthesia or the breathing tube placed in your throat during surgery. If this causes discomfort, gargle with warm salt water. The discomfort should disappear within 24 hours.  If you had a scopolamine patch placed behind your ear for the management of post- operative nausea and/or vomiting:  1. The medication in the patch is effective for 72 hours, after which it should be removed.  Wrap patch in a tissue and discard in the trash. Wash hands thoroughly with soap and water. 2. You may remove the patch earlier than 72 hours if you experience unpleasant side effects which may include dry mouth, dizziness or visual disturbances. 3. Avoid touching the patch. Wash your hands with soap and water after contact with the patch.    No ibuprofen, Advil, Aleve, Motrin, or naproxen until after 3:45 pm today if needed.

## 2020-04-19 NOTE — Anesthesia Procedure Notes (Signed)
Procedure Name: LMA Insertion Date/Time: 04/19/2020 8:57 AM Performed by: Francie Massing, CRNA Pre-anesthesia Checklist: Patient identified, Emergency Drugs available, Suction available and Patient being monitored Patient Re-evaluated:Patient Re-evaluated prior to induction Oxygen Delivery Method: Circle system utilized Preoxygenation: Pre-oxygenation with 100% oxygen Induction Type: IV induction Ventilation: Mask ventilation without difficulty LMA: LMA inserted LMA Size: 4.0 Number of attempts: 1 Airway Equipment and Method: Bite block Placement Confirmation: positive ETCO2 Tube secured with: Tape Dental Injury: Teeth and Oropharynx as per pre-operative assessment

## 2020-04-22 ENCOUNTER — Encounter (HOSPITAL_BASED_OUTPATIENT_CLINIC_OR_DEPARTMENT_OTHER): Payer: Self-pay | Admitting: Urology

## 2020-04-22 DIAGNOSIS — N201 Calculus of ureter: Secondary | ICD-10-CM | POA: Diagnosis not present

## 2020-04-22 DIAGNOSIS — R8271 Bacteriuria: Secondary | ICD-10-CM | POA: Diagnosis not present

## 2020-04-26 DIAGNOSIS — R8271 Bacteriuria: Secondary | ICD-10-CM | POA: Diagnosis not present

## 2020-04-26 DIAGNOSIS — N201 Calculus of ureter: Secondary | ICD-10-CM | POA: Diagnosis not present

## 2020-05-29 DIAGNOSIS — N201 Calculus of ureter: Secondary | ICD-10-CM | POA: Diagnosis not present

## 2020-05-29 DIAGNOSIS — R8271 Bacteriuria: Secondary | ICD-10-CM | POA: Diagnosis not present

## 2020-10-17 IMAGING — US US ABDOMEN LIMITED
1 series · 13 of 25 positions shown · non-contrast
Comparison: No pertinent prior studies available for comparison.

CLINICAL DATA: Acid phosphatase elevated. Distress, epigastric.
Additional history provided by technologist: Epigastric pain for 3
months, elevated alkaline phosphatase.

EXAM:
ULTRASOUND ABDOMEN LIMITED RIGHT UPPER QUADRANT

[Series 1: us abdomen limited · 0.22mm/px · 13 of 39 slices shown]
[im 1/39]
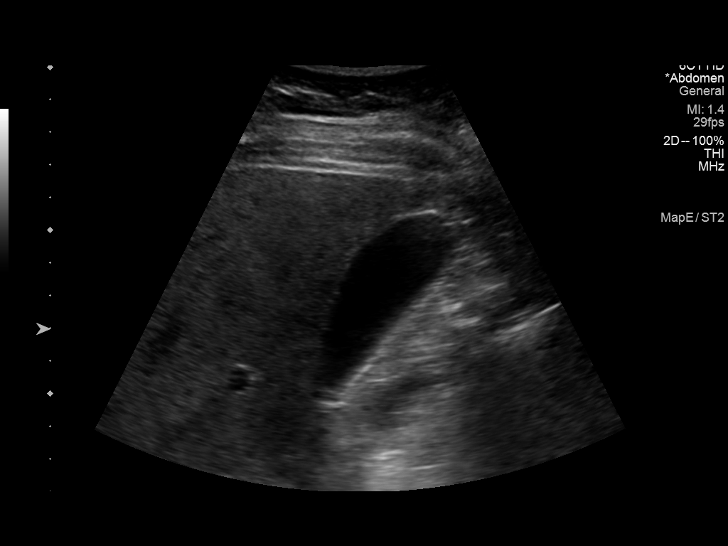
[im 4/39]
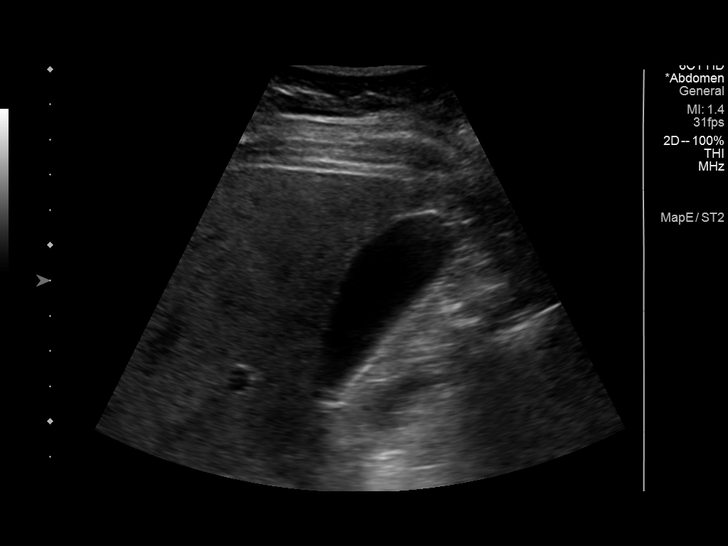
[im 7/39]
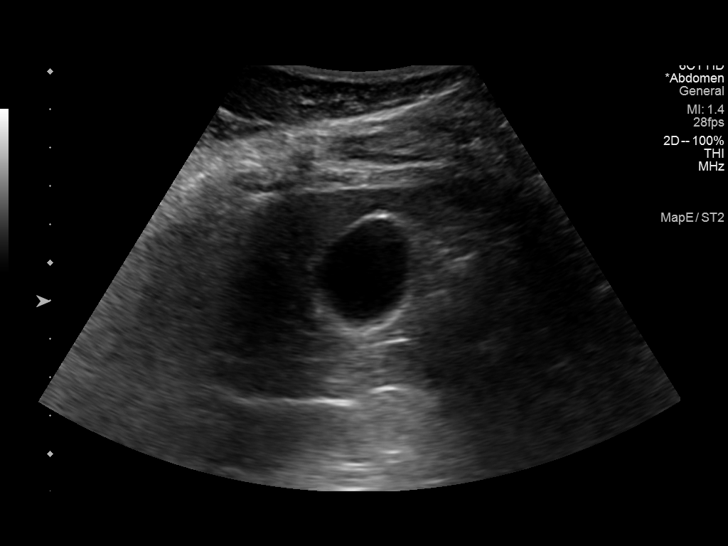
[im 10/39]
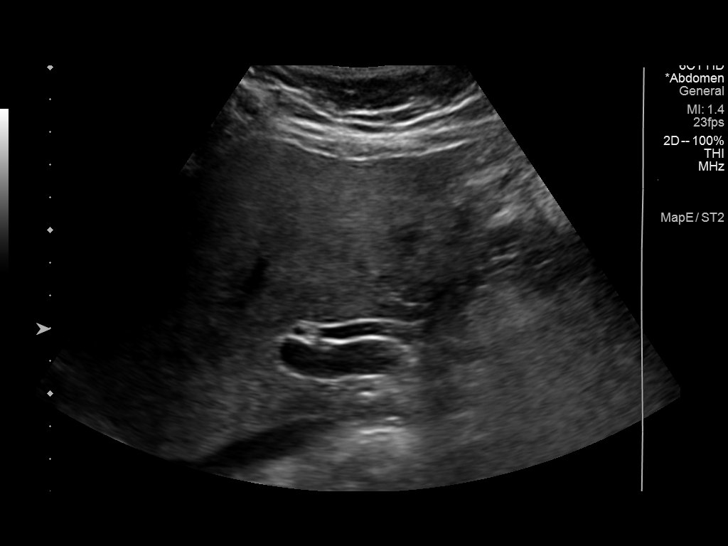
[im 13/39]
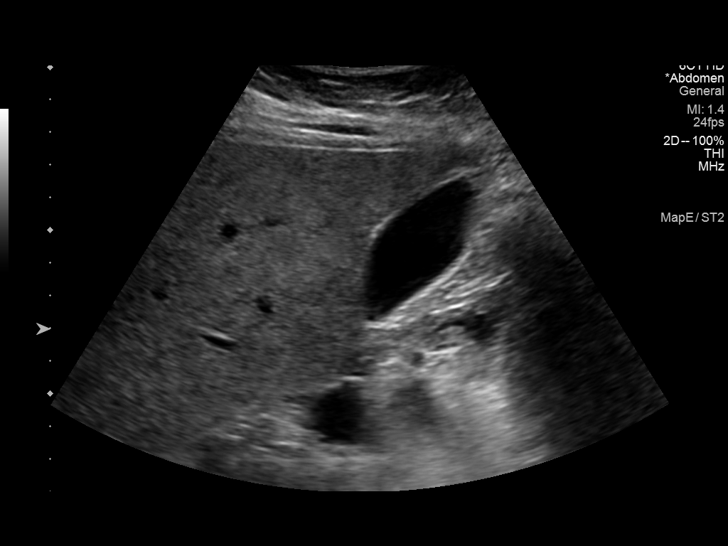
[im 16/39]
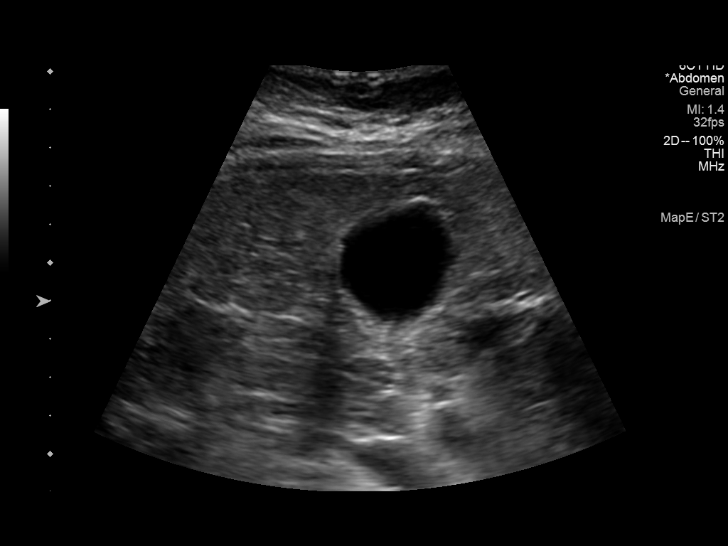
[im 20/39]
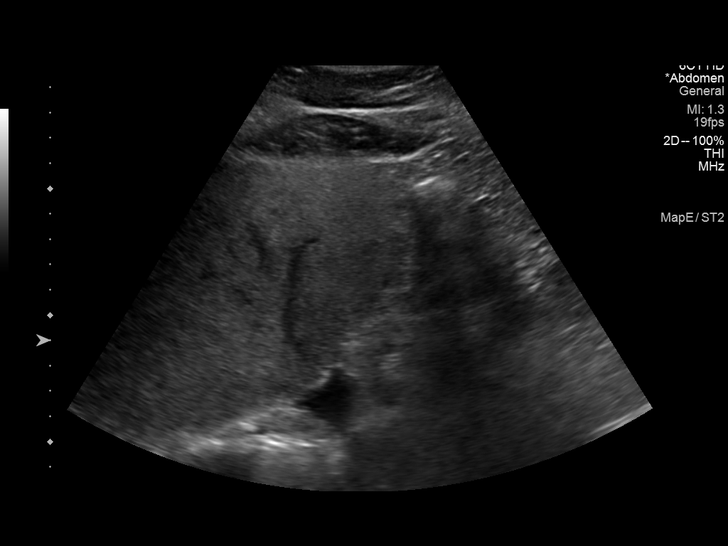
[im 23/39]
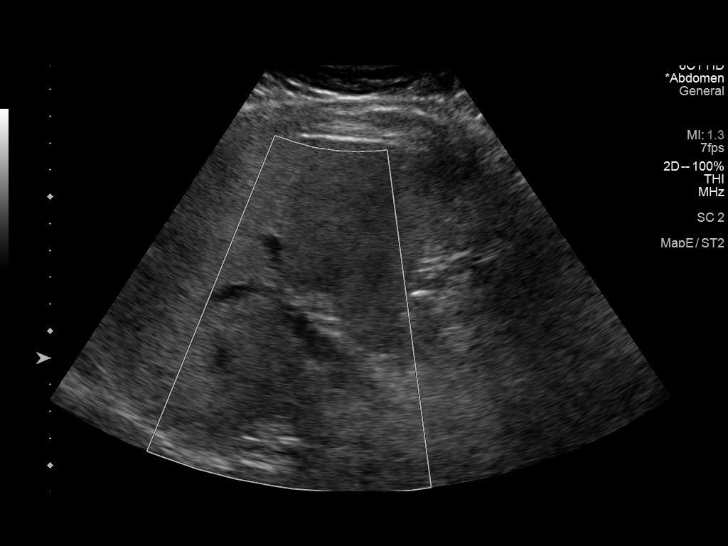
[im 26/39]
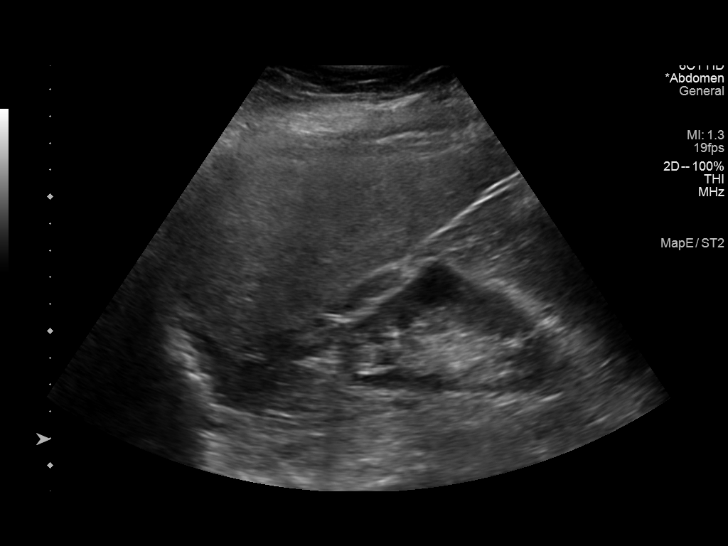
[im 29/39]
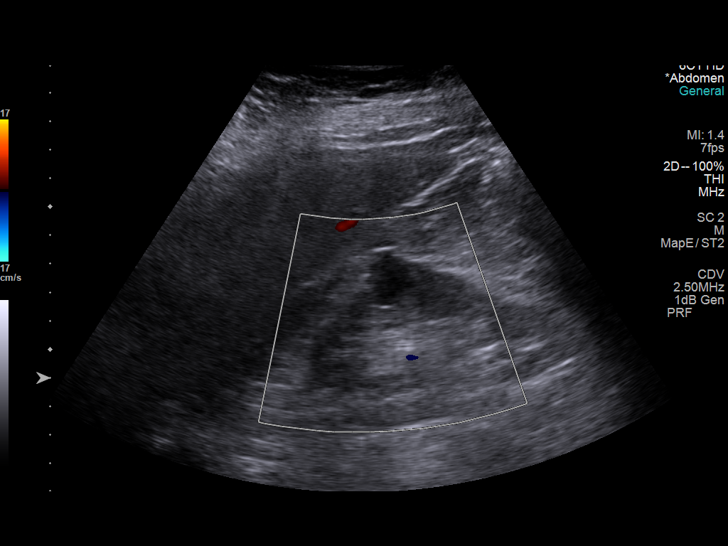
[im 32/39]
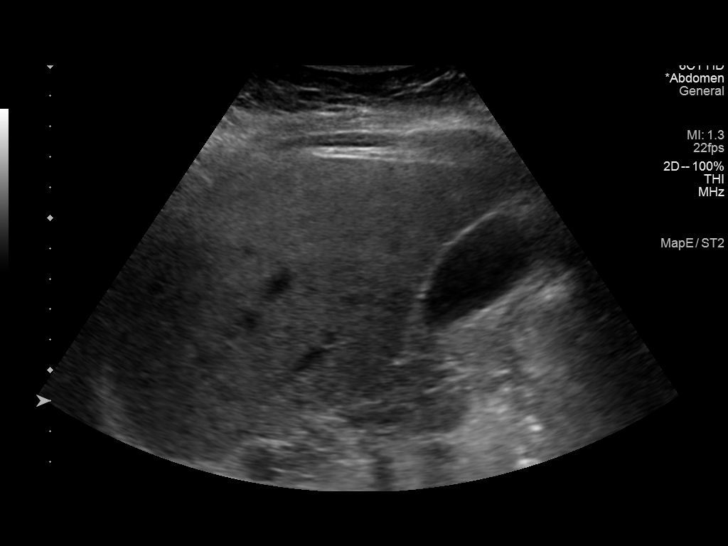
[im 35/39]
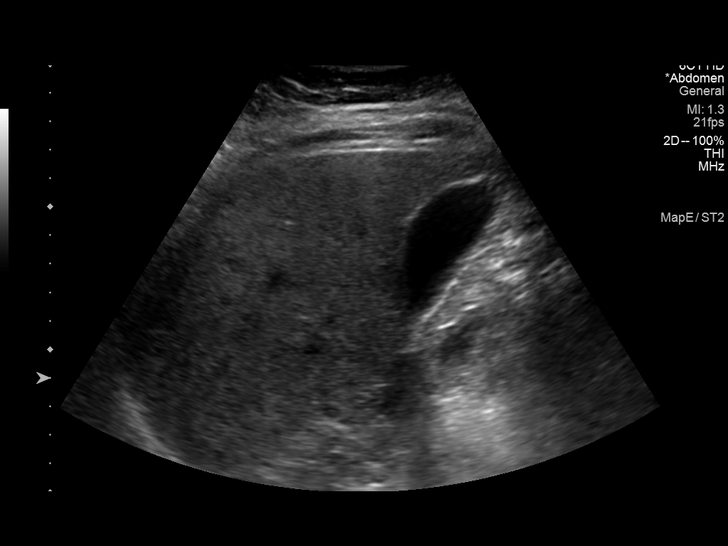
[im 39/39]
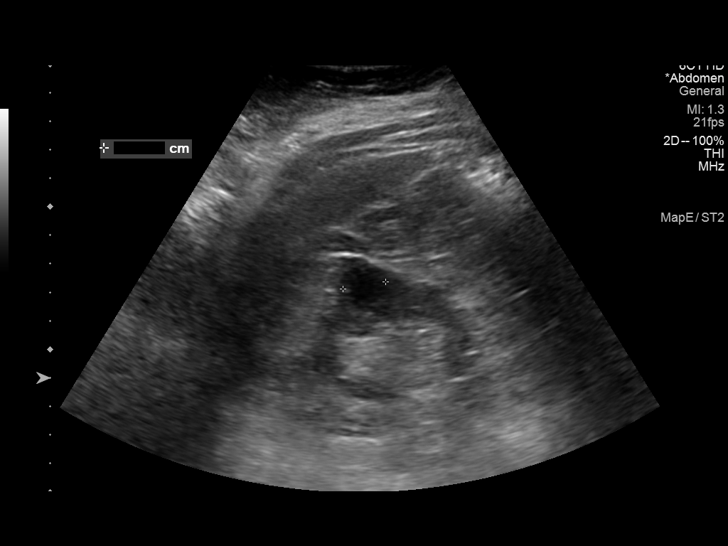

[13 of 25 positions shown; findings below may reference images not displayed]

FINDINGS: Evaluation is limited due to patient body habitus.

Gallbladder:

No gallstones or wall thickening visualized. No sonographic Murphy
sign noted by sonographer.

Common bile duct:

Diameter: 4 mm, within normal limits.

Liver:

No focal liver lesion is identified. Diffusely increased hepatic
parenchymal echogenicity. Interrogated portal vein is patent on
color Doppler imaging with normal direction of blood flow towards
the liver.

Other: 1.8 x 1.8 x 1.5 cm hypoechoic exophytic focus arising from
the interpolar right kidney. This may reflect a cyst, although there
is suggestion of a thick wall.
IMPRESSION: 1.8 x 1.8 x 1.5 cm hypoechoic exophytic focus arising from the
interpolar right kidney. This may reflect a cyst, although there is
suggestion of a thick wall. Contrast-enhanced MRI is recommended to
exclude alternative etiologies (i.e. cystic renal cell carcinoma).

Diffusely increased hepatic parenchymal echogenicity. This is a
nonspecific finding which may be seen in the setting of hepatic
steatosis or other chronic hepatic parenchymal disease.

Otherwise unremarkable right upper quadrant ultrasound as detailed.

## 2020-10-21 DIAGNOSIS — F329 Major depressive disorder, single episode, unspecified: Secondary | ICD-10-CM | POA: Diagnosis not present

## 2020-10-21 DIAGNOSIS — R030 Elevated blood-pressure reading, without diagnosis of hypertension: Secondary | ICD-10-CM | POA: Diagnosis not present

## 2020-10-21 DIAGNOSIS — G4700 Insomnia, unspecified: Secondary | ICD-10-CM | POA: Diagnosis not present

## 2020-10-21 DIAGNOSIS — K219 Gastro-esophageal reflux disease without esophagitis: Secondary | ICD-10-CM | POA: Diagnosis not present

## 2021-01-25 DIAGNOSIS — M222X2 Patellofemoral disorders, left knee: Secondary | ICD-10-CM | POA: Diagnosis not present

## 2021-02-10 DIAGNOSIS — M25562 Pain in left knee: Secondary | ICD-10-CM | POA: Diagnosis not present

## 2021-03-25 DIAGNOSIS — Z20822 Contact with and (suspected) exposure to covid-19: Secondary | ICD-10-CM | POA: Diagnosis not present

## 2021-11-26 DIAGNOSIS — R0789 Other chest pain: Secondary | ICD-10-CM | POA: Diagnosis not present

## 2021-11-26 DIAGNOSIS — R030 Elevated blood-pressure reading, without diagnosis of hypertension: Secondary | ICD-10-CM | POA: Diagnosis not present

## 2021-11-26 DIAGNOSIS — R0602 Shortness of breath: Secondary | ICD-10-CM | POA: Diagnosis not present

## 2021-11-27 ENCOUNTER — Other Ambulatory Visit: Payer: Self-pay | Admitting: Family Medicine

## 2021-11-27 ENCOUNTER — Ambulatory Visit
Admission: RE | Admit: 2021-11-27 | Discharge: 2021-11-27 | Disposition: A | Discharge status: Home / Self Care | Payer: Medicare HMO | Source: Ambulatory Visit | Attending: Family Medicine | Admitting: Family Medicine

## 2021-11-27 DIAGNOSIS — S2241XA Multiple fractures of ribs, right side, initial encounter for closed fracture: Secondary | ICD-10-CM | POA: Diagnosis not present

## 2021-11-27 DIAGNOSIS — I700 Atherosclerosis of aorta: Secondary | ICD-10-CM | POA: Diagnosis not present

## 2021-11-27 DIAGNOSIS — R0789 Other chest pain: Secondary | ICD-10-CM

## 2021-12-22 DIAGNOSIS — Z0000 Encounter for general adult medical examination without abnormal findings: Secondary | ICD-10-CM | POA: Diagnosis not present

## 2021-12-22 DIAGNOSIS — E785 Hyperlipidemia, unspecified: Secondary | ICD-10-CM | POA: Diagnosis not present

## 2021-12-22 DIAGNOSIS — Z5181 Encounter for therapeutic drug level monitoring: Secondary | ICD-10-CM | POA: Diagnosis not present

## 2021-12-22 DIAGNOSIS — E559 Vitamin D deficiency, unspecified: Secondary | ICD-10-CM | POA: Diagnosis not present

## 2021-12-22 DIAGNOSIS — K219 Gastro-esophageal reflux disease without esophagitis: Secondary | ICD-10-CM | POA: Diagnosis not present

## 2021-12-24 DIAGNOSIS — G4700 Insomnia, unspecified: Secondary | ICD-10-CM | POA: Diagnosis not present

## 2021-12-24 DIAGNOSIS — F329 Major depressive disorder, single episode, unspecified: Secondary | ICD-10-CM | POA: Diagnosis not present

## 2021-12-24 DIAGNOSIS — K219 Gastro-esophageal reflux disease without esophagitis: Secondary | ICD-10-CM | POA: Diagnosis not present

## 2021-12-24 DIAGNOSIS — E559 Vitamin D deficiency, unspecified: Secondary | ICD-10-CM | POA: Diagnosis not present

## 2021-12-24 DIAGNOSIS — Z23 Encounter for immunization: Secondary | ICD-10-CM | POA: Diagnosis not present

## 2021-12-24 DIAGNOSIS — R5383 Other fatigue: Secondary | ICD-10-CM | POA: Diagnosis not present

## 2021-12-24 DIAGNOSIS — E538 Deficiency of other specified B group vitamins: Secondary | ICD-10-CM | POA: Diagnosis not present

## 2021-12-24 DIAGNOSIS — Z0000 Encounter for general adult medical examination without abnormal findings: Secondary | ICD-10-CM | POA: Diagnosis not present

## 2021-12-24 DIAGNOSIS — M79672 Pain in left foot: Secondary | ICD-10-CM | POA: Diagnosis not present

## 2022-02-25 DIAGNOSIS — H40013 Open angle with borderline findings, low risk, bilateral: Secondary | ICD-10-CM | POA: Diagnosis not present

## 2022-02-25 DIAGNOSIS — Z0100 Encounter for examination of eyes and vision without abnormal findings: Secondary | ICD-10-CM | POA: Diagnosis not present

## 2022-02-25 DIAGNOSIS — H02831 Dermatochalasis of right upper eyelid: Secondary | ICD-10-CM | POA: Diagnosis not present

## 2022-02-25 DIAGNOSIS — H02834 Dermatochalasis of left upper eyelid: Secondary | ICD-10-CM | POA: Diagnosis not present

## 2022-02-25 DIAGNOSIS — H33191 Other retinoschisis and retinal cysts, right eye: Secondary | ICD-10-CM | POA: Diagnosis not present

## 2022-02-25 DIAGNOSIS — Z961 Presence of intraocular lens: Secondary | ICD-10-CM | POA: Diagnosis not present

## 2022-03-02 DIAGNOSIS — E559 Vitamin D deficiency, unspecified: Secondary | ICD-10-CM | POA: Diagnosis not present

## 2022-03-02 DIAGNOSIS — E538 Deficiency of other specified B group vitamins: Secondary | ICD-10-CM | POA: Diagnosis not present

## 2022-07-15 DIAGNOSIS — Z9181 History of falling: Secondary | ICD-10-CM | POA: Diagnosis not present

## 2022-07-15 DIAGNOSIS — R030 Elevated blood-pressure reading, without diagnosis of hypertension: Secondary | ICD-10-CM | POA: Diagnosis not present

## 2022-07-15 DIAGNOSIS — R42 Dizziness and giddiness: Secondary | ICD-10-CM | POA: Diagnosis not present

## 2022-07-15 DIAGNOSIS — R7301 Impaired fasting glucose: Secondary | ICD-10-CM | POA: Diagnosis not present

## 2022-07-15 DIAGNOSIS — M79675 Pain in left toe(s): Secondary | ICD-10-CM | POA: Diagnosis not present

## 2022-07-15 DIAGNOSIS — Z23 Encounter for immunization: Secondary | ICD-10-CM | POA: Diagnosis not present

## 2022-07-15 DIAGNOSIS — E538 Deficiency of other specified B group vitamins: Secondary | ICD-10-CM | POA: Diagnosis not present

## 2022-07-16 ENCOUNTER — Ambulatory Visit
Admission: RE | Admit: 2022-07-16 | Discharge: 2022-07-16 | Disposition: A | Discharge status: Home / Self Care | Payer: Medicare HMO | Source: Ambulatory Visit | Attending: Family Medicine | Admitting: Family Medicine

## 2022-07-16 ENCOUNTER — Other Ambulatory Visit: Payer: Self-pay | Admitting: Family Medicine

## 2022-07-16 DIAGNOSIS — M79675 Pain in left toe(s): Secondary | ICD-10-CM | POA: Diagnosis not present

## 2022-07-16 DIAGNOSIS — M79676 Pain in unspecified toe(s): Secondary | ICD-10-CM

## 2022-07-29 DIAGNOSIS — E538 Deficiency of other specified B group vitamins: Secondary | ICD-10-CM | POA: Diagnosis not present

## 2022-08-05 DIAGNOSIS — E559 Vitamin D deficiency, unspecified: Secondary | ICD-10-CM | POA: Diagnosis not present

## 2022-08-05 DIAGNOSIS — E119 Type 2 diabetes mellitus without complications: Secondary | ICD-10-CM | POA: Diagnosis not present

## 2022-08-05 DIAGNOSIS — R5383 Other fatigue: Secondary | ICD-10-CM | POA: Diagnosis not present

## 2022-08-05 DIAGNOSIS — L609 Nail disorder, unspecified: Secondary | ICD-10-CM | POA: Diagnosis not present

## 2022-08-05 DIAGNOSIS — E538 Deficiency of other specified B group vitamins: Secondary | ICD-10-CM | POA: Diagnosis not present

## 2022-08-12 DIAGNOSIS — E559 Vitamin D deficiency, unspecified: Secondary | ICD-10-CM | POA: Diagnosis not present

## 2022-08-18 ENCOUNTER — Other Ambulatory Visit: Payer: Self-pay | Admitting: Podiatry

## 2022-08-18 ENCOUNTER — Ambulatory Visit: Payer: Medicare HMO | Admitting: Podiatry

## 2022-08-18 ENCOUNTER — Ambulatory Visit (INDEPENDENT_AMBULATORY_CARE_PROVIDER_SITE_OTHER): Payer: Medicare HMO

## 2022-08-18 DIAGNOSIS — M2022 Hallux rigidus, left foot: Secondary | ICD-10-CM | POA: Diagnosis not present

## 2022-08-18 DIAGNOSIS — M21612 Bunion of left foot: Secondary | ICD-10-CM

## 2022-08-18 DIAGNOSIS — L601 Onycholysis: Secondary | ICD-10-CM

## 2022-08-18 NOTE — Patient Instructions (Signed)
Soak Instructions    THE DAY AFTER THE PROCEDURE  Place 1/4 cup of epsom salts in a quart of warm tap water.  Submerge your foot or feet with outer bandage intact for the initial soak; this will allow the bandage to become moist and wet for easy lift off.  Once you remove your bandage, continue to soak in the solution for 20 minutes.  This soak should be done twice a day.  Next, remove your foot or feet from solution, blot dry the affected area and cover.  You may use a band aid large enough to cover the area or use gauze and tape.  Apply other medications to the area as directed by the doctor such as polysporin neosporin.  IF YOUR SKIN BECOMES IRRITATED WHILE USING THESE INSTRUCTIONS, IT IS OKAY TO SWITCH TO  WHITE VINEGAR AND WATER. Or you may use antibacterial soap and water to keep the toe clean  Monitor for any signs/symptoms of infection. Call the office immediately if any occur or go directly to the emergency room. Call with any questions/concerns.  Pre-Operative Instructions  Congratulations, you have decided to take an important step to improving your quality of life.  You can be assured that the doctors of [facility] will be with you every step of the way.  Plan to be at the surgery center/hospital at least 1 (one) hour prior to your scheduled time unless otherwise directed by the surgical center/hospital staff.  You must have a responsible adult accompany you, remain during the surgery and drive you home.  Make sure you have directions to the surgical center/hospital and know how to get there on time. For hospital based surgery you will need to obtain a history and physical form from your family physician within 1 month prior to the date of surgery- we will give you a form for you primary physician.  We make every effort to accommodate the date you request for surgery.  There are however, times where surgery dates or times have to be moved.  We will contact you as soon as possible  if a change in schedule is required.   No Aspirin/Ibuprofen for one week before surgery.  If you are on aspirin, any non-steroidal anti-inflammatory medications (Mobic, Aleve, Ibuprofen) you should stop taking it 7 days prior to your surgery.  You make take Tylenol  For pain prior to surgery.  Medications- If you are taking daily heart and blood pressure medications, seizure, reflux, allergy, asthma, anxiety, pain or diabetes medications, make sure the surgery center/hospital is aware before the day of surgery so they may notify you which medications to take or avoid the day of surgery. No food or drink after midnight the night before surgery unless directed otherwise by surgical center/hospital staff. No alcoholic beverages 24 hours prior to surgery.  No smoking 24 hours prior to or 24 hours after surgery. Wear loose pants or shorts- loose enough to fit over bandages, boots, and casts. No slip on shoes, sneakers are best. Bring your boot with you to the surgery center/hospital.  Also bring crutches or a walker if your physician has prescribed it for you.  If you do not have this equipment, it will be provided for you after surgery. If you have not been contracted by the surgery center/hospital by the day before your surgery, call to confirm the date and time of your surgery. Leave-time from work may vary depending on the type of surgery you have.  Appropriate arrangements should  be made prior to surgery with your employer. Prescriptions will be provided immediately following surgery by your doctor.  Have these filled as soon as possible after surgery and take the medication as directed. Remove nail polish on the operative foot. Wash the night before surgery.  The night before surgery wash the foot and leg well with the antibacterial soap provided and water paying special attention to beneath the toenails and in between the toes.  Rinse thoroughly with water and dry well with a towel.  Perform this wash  unless told not to do so by your physician.  Enclosed: 1 Ice pack (please put in freezer the night before surgery)   1 Hibiclens skin cleaner   Pre-op Instructions  If you have any questions regarding the instructions, do not hesitate to call our office at any point during this process.   [place]: [address] [place], [place] [address] [place]: [address] [place], [place] [address] [phone]  Dr. Celesta Gentile, DPM

## 2022-08-18 NOTE — Progress Notes (Addendum)
Subjective:   Patient ID: Karla Watson, female   DOB: 72 y.o.   MRN: [id number]   HPI Chief Complaint  Patient presents with   Nail Problem    Pt states she hurt her toe nail June 3rd and it tuned black and was really painful, as of today the pain has gone away but now the nail is lifting   Bunions    Pt also wants to know if in the future if her bunion will need attention    72 year old female presents the office with above concerns.  She states that she had her toenail in June and at that time the nail turned dark and was painful.  The pain is improved and the nail is lifted.  She states now the nail is lifting although the pain is improved.  No drainage or pus.  She is also had ongoing pain with a bunion on her left foot.  There is no ongoing for quite some time.  She has pain when trying to move the joint.  She tried shoe modification off without significant improvement.  She wants to discuss other options.  Review of Systems  All other systems reviewed and are negative.   Past Medical History:  Diagnosis Date   Arthritis    oa hands and left foot   Depression    History of kidney stones    Insomnia    Kidney abscess 2012   Pleurisy 12/2018   tx with antibiotics   Sepsis (HCC)    in [place] from 03-07-2020 to 03-09-2020   Urinary frequency    Vertigo occ   Wears glasses     Past Surgical History:  Procedure Laterality Date   CATARACT EXTRACTION Bilateral yrs ago   CEREBRAL ANEURYSM REPAIR  15 yrs ago   no problems after coiling done   CYSTOSCOPY W/ URETERAL STENT PLACEMENT Left 03/07/2020   Procedure: CYSTOSCOPY WITH RETROGRADE PYELOGRAM/URETERAL STENT PLACEMENT;  Surgeon: Jannifer Hick, MD;  Location: [facility] OR;  Service: Urology;  Laterality: Left;   CYSTOSCOPY/URETEROSCOPY/HOLMIUM LASER/STENT PLACEMENT Left 04/19/2020   Procedure: CYSTOSCOPY/RETROGRADE/URETEROSCOPY/HOLMIUM LASER/STENT PLACEMENT;  Surgeon: Jannifer Hick, MD;  Location: [facility];  Service:  Urology;  Laterality: Left;  ONLY NEEDS 60 MIN   TUBAL LIGATION  yrs ago     Current Outpatient Medications:    buPROPion (WELLBUTRIN XL) 300 MG 24 hr tablet, Take 300 mg by mouth daily., Disp: , Rfl:    FLUoxetine (PROZAC) 40 MG capsule, Take 40 mg by mouth at bedtime., Disp: , Rfl:    oxyCODONE-acetaminophen (PERCOCET) 5-325 MG tablet, Take 1 tablet by mouth every 4 (four) hours as needed for up to 18 doses for severe pain., Disp: 18 tablet, Rfl: 0   traZODone (DESYREL) 100 MG tablet, Take 100 mg by mouth at bedtime., Disp: , Rfl:   Allergies  Allergen Reactions   Erythromycin Nausea And Vomiting          Objective:  Physical Exam  General: AAO x3, NAD  Dermatological: On the left big toe the nail is lifting and there is localized edema but no erythema, drainage or pus or any signs of infection otherwise.  No open lesions.  Vascular: Dorsalis Pedis artery and Posterior Tibial artery pedal pulses are 2/4 bilateral with immedate capillary fill time. There is no pain with calf compression, swelling, warmth, erythema.   Neruologic: Grossly intact via light touch bilateral.   Musculoskeletal: Dorsal spurring present about the left foot on the first MPJ  there is significant decreased range of motion with crepitation with range of motion.  There is pain with range of motion.  Localized edema of the first to be stable is no erythema or warmth.    Assessment:   Left hallux onycholysis; hallux rigidus     Plan:  -Treatment options discussed including all alternatives, risks, and complications -Etiology of symptoms were discussed -X-rays obtained reviewed.  3 views of the left foot were obtained.  Significant arthritic is present of the first MPJ with greater than 50% cartilage loss of the joint. -At this time, discussed total nail removal without chemical matricectomy to the left hallux toenail.  Risks and complications were discussed with the patient for which they understand and   verbally consent to the procedure. Under sterile conditions a total of 3 mL of a mixture of 2% lidocaine plain and 0.5% Marcaine plain was infiltrated in a hallux block fashion. Once anesthetized, the skin was prepped in sterile fashion. A tourniquet was then applied. Next the left nail was excised making sure to remove the entire offending nail border. Once the nail was  Removed, the area was debrided and the underlying skin was intact. The area was irrigated and hemostasis was obtained.  A dry sterile dressing was applied. After application of the dressing the tourniquet was removed and there is found to be an immediate capillary refill time to the digit. The patient tolerated the procedure well any complications. Post procedure instructions were discussed the patient for which he verbally understood. Follow-up in one week for nail check or sooner if any problems are to arise. Discussed signs/symptoms of worsening infection and directed to call the office immediately should any occur or go directly to the emergency room. In the meantime, encouraged to call the office with any questions, concerns, changes symptoms. -In regards to the "bunion"/hallux rigidus we discussed the conservative as well as surgical treatment options.  Conservatively discussed shoe modifications, orthotics with Morton's extension, injection therapy.  After discussions to proceed with surgical intervention.  I discussed with her first MTPJ arthrodesis.  She wishes to proceed with surgery. -The incision placement as well as the postoperative course was discussed with the patient. I discussed risks of the surgery which include, but not limited to, infection, bleeding, pain, swelling, need for further surgery, delayed or nonhealing, painful or ugly scar, numbness or sensation changes, over/under correction, recurrence, transfer lesions, further deformity, hardware failure, DVT/PE, loss of toe/foot. Patient understands these risks and wishes to  proceed with surgery. The surgical consent was reviewed with the patient all 3 pages were signed. No promises or guarantees were given to the outcome of the procedure. All questions were answered to the best of my ability. Before the surgery the patient was encouraged to call the office if there is any further questions. The surgery will be performed at the [facility] on an outpatient basis.   Vivi Barrack DPM

## 2022-08-20 DIAGNOSIS — E119 Type 2 diabetes mellitus without complications: Secondary | ICD-10-CM | POA: Diagnosis not present

## 2022-08-20 DIAGNOSIS — E538 Deficiency of other specified B group vitamins: Secondary | ICD-10-CM | POA: Diagnosis not present

## 2022-08-20 DIAGNOSIS — E559 Vitamin D deficiency, unspecified: Secondary | ICD-10-CM | POA: Diagnosis not present

## 2022-08-26 DIAGNOSIS — Z6834 Body mass index (BMI) 34.0-34.9, adult: Secondary | ICD-10-CM | POA: Diagnosis not present

## 2022-08-26 DIAGNOSIS — E119 Type 2 diabetes mellitus without complications: Secondary | ICD-10-CM | POA: Diagnosis not present

## 2022-08-26 DIAGNOSIS — E668 Other obesity: Secondary | ICD-10-CM | POA: Diagnosis not present

## 2022-08-28 ENCOUNTER — Telehealth: Payer: Self-pay | Admitting: Urology

## 2022-08-28 NOTE — Telephone Encounter (Signed)
DOS - 09/09/22  HALLUX MPJ FUSION LEFT --- [id number]  [employer]   PER [employer] WEBSITE FOR CPT CODE [phone] HAS BEEN APPROVED, AUTH # [id number], GOOD FROM 09/09/22 - 11/09/22.  TRACKING # [id number]

## 2022-08-31 ENCOUNTER — Ambulatory Visit (INDEPENDENT_AMBULATORY_CARE_PROVIDER_SITE_OTHER): Payer: Medicare HMO | Admitting: Podiatry

## 2022-08-31 DIAGNOSIS — M2022 Hallux rigidus, left foot: Secondary | ICD-10-CM | POA: Diagnosis not present

## 2022-08-31 NOTE — Progress Notes (Signed)
Subjective: Chief Complaint  Patient presents with   Nail Problem    Had toenail removal left / feels better has questions about upcoming surgery    72 year old female presents the office today for proper evaluation after undergoing total nail removal.  States that this has been doing well and no pain.  No swelling redness or any drainage.  She has a couple questions that are upcoming surgery.  No other concerns.  Objective: AAO x3, NAD DP/PT pulses palpable bilaterally, CRT less than 3 seconds Status post nail removal.  This appears to be healed and small scab is still present.  There is no drainage or pus.  No open lesions.  No erythema or warmth. Otherwise exam is unchanged.  Decreased range of motion first MPJ with crepitation on range of motion of the first MTPJ. No pain with calf compression, swelling, warmth, erythema  Assessment: Status post total nail removal; arthritis  Plan: -All treatment options discussed with the patient including all alternatives, risks, complications.  -Procedure site is healing well.  Keep antibiotic ointment and a bandage on during the day but can leave the area open at nighttime.  Monitor any signs or symptoms of infection or reoccurrence. -She had a couple questions regards to her surgery.  We can discuss the procedure, recovery.  I will order her knee scooter to help facilitate nonweightbearing.  She has questions about the timing unfortunately I do not have a time for her yet.  They will let know the day before. -Patient encouraged to call the office with any questions, concerns, change in symptoms.   Vivi Barrack DPM

## 2022-09-08 ENCOUNTER — Telehealth: Payer: Self-pay | Admitting: Podiatry

## 2022-09-08 NOTE — Telephone Encounter (Signed)
PT CALLED TO CANCEL SURGERY DUE TO FINANCIAL REASONS. NOTIFIED DR Ardelle Anton AND [facility],

## 2022-09-14 ENCOUNTER — Encounter: Payer: Medicare HMO | Admitting: Podiatry

## 2022-09-14 DIAGNOSIS — Z6833 Body mass index (BMI) 33.0-33.9, adult: Secondary | ICD-10-CM | POA: Diagnosis not present

## 2022-09-14 DIAGNOSIS — R030 Elevated blood-pressure reading, without diagnosis of hypertension: Secondary | ICD-10-CM | POA: Diagnosis not present

## 2022-09-14 DIAGNOSIS — Z87442 Personal history of urinary calculi: Secondary | ICD-10-CM | POA: Diagnosis not present

## 2022-09-14 DIAGNOSIS — F419 Anxiety disorder, unspecified: Secondary | ICD-10-CM | POA: Diagnosis not present

## 2022-09-14 DIAGNOSIS — E669 Obesity, unspecified: Secondary | ICD-10-CM | POA: Diagnosis not present

## 2022-09-14 DIAGNOSIS — K219 Gastro-esophageal reflux disease without esophagitis: Secondary | ICD-10-CM | POA: Diagnosis not present

## 2022-09-14 DIAGNOSIS — F331 Major depressive disorder, recurrent, moderate: Secondary | ICD-10-CM | POA: Diagnosis not present

## 2022-09-14 DIAGNOSIS — R7303 Prediabetes: Secondary | ICD-10-CM | POA: Diagnosis not present

## 2022-09-14 DIAGNOSIS — Z0000 Encounter for general adult medical examination without abnormal findings: Secondary | ICD-10-CM | POA: Diagnosis not present

## 2022-09-24 ENCOUNTER — Encounter: Payer: Medicare HMO | Admitting: Podiatry

## 2022-10-06 DIAGNOSIS — E119 Type 2 diabetes mellitus without complications: Secondary | ICD-10-CM | POA: Diagnosis not present

## 2022-10-06 DIAGNOSIS — E559 Vitamin D deficiency, unspecified: Secondary | ICD-10-CM | POA: Diagnosis not present

## 2022-10-06 DIAGNOSIS — Z23 Encounter for immunization: Secondary | ICD-10-CM | POA: Diagnosis not present

## 2022-10-06 DIAGNOSIS — I700 Atherosclerosis of aorta: Secondary | ICD-10-CM | POA: Diagnosis not present

## 2022-10-06 DIAGNOSIS — E538 Deficiency of other specified B group vitamins: Secondary | ICD-10-CM | POA: Diagnosis not present

## 2022-10-06 DIAGNOSIS — G4700 Insomnia, unspecified: Secondary | ICD-10-CM | POA: Diagnosis not present

## 2022-10-06 DIAGNOSIS — M542 Cervicalgia: Secondary | ICD-10-CM | POA: Diagnosis not present

## 2022-10-06 DIAGNOSIS — R42 Dizziness and giddiness: Secondary | ICD-10-CM | POA: Diagnosis not present

## 2022-10-08 ENCOUNTER — Encounter: Payer: Medicare HMO | Admitting: Podiatry

## 2022-10-26 DIAGNOSIS — E119 Type 2 diabetes mellitus without complications: Secondary | ICD-10-CM | POA: Diagnosis not present

## 2022-10-26 DIAGNOSIS — E559 Vitamin D deficiency, unspecified: Secondary | ICD-10-CM | POA: Diagnosis not present

## 2022-10-26 DIAGNOSIS — E538 Deficiency of other specified B group vitamins: Secondary | ICD-10-CM | POA: Diagnosis not present

## 2023-01-04 DIAGNOSIS — E559 Vitamin D deficiency, unspecified: Secondary | ICD-10-CM | POA: Diagnosis not present

## 2023-01-04 DIAGNOSIS — E119 Type 2 diabetes mellitus without complications: Secondary | ICD-10-CM | POA: Diagnosis not present

## 2023-01-04 DIAGNOSIS — Z23 Encounter for immunization: Secondary | ICD-10-CM | POA: Diagnosis not present

## 2023-01-04 DIAGNOSIS — R42 Dizziness and giddiness: Secondary | ICD-10-CM | POA: Diagnosis not present

## 2023-01-04 DIAGNOSIS — F329 Major depressive disorder, single episode, unspecified: Secondary | ICD-10-CM | POA: Diagnosis not present

## 2023-01-04 DIAGNOSIS — K219 Gastro-esophageal reflux disease without esophagitis: Secondary | ICD-10-CM | POA: Diagnosis not present

## 2023-01-04 DIAGNOSIS — Z0000 Encounter for general adult medical examination without abnormal findings: Secondary | ICD-10-CM | POA: Diagnosis not present

## 2023-01-04 DIAGNOSIS — G4700 Insomnia, unspecified: Secondary | ICD-10-CM | POA: Diagnosis not present

## 2023-01-04 DIAGNOSIS — E538 Deficiency of other specified B group vitamins: Secondary | ICD-10-CM | POA: Diagnosis not present

## 2023-01-04 DIAGNOSIS — M79672 Pain in left foot: Secondary | ICD-10-CM | POA: Diagnosis not present

## 2023-01-18 DIAGNOSIS — R296 Repeated falls: Secondary | ICD-10-CM | POA: Diagnosis not present

## 2023-01-18 DIAGNOSIS — M542 Cervicalgia: Secondary | ICD-10-CM | POA: Diagnosis not present

## 2023-01-18 DIAGNOSIS — R42 Dizziness and giddiness: Secondary | ICD-10-CM | POA: Diagnosis not present

## 2023-01-25 DIAGNOSIS — R296 Repeated falls: Secondary | ICD-10-CM | POA: Diagnosis not present

## 2023-01-25 DIAGNOSIS — M542 Cervicalgia: Secondary | ICD-10-CM | POA: Diagnosis not present

## 2023-01-25 DIAGNOSIS — R42 Dizziness and giddiness: Secondary | ICD-10-CM | POA: Diagnosis not present

## 2023-01-28 DIAGNOSIS — L089 Local infection of the skin and subcutaneous tissue, unspecified: Secondary | ICD-10-CM | POA: Diagnosis not present

## 2023-01-28 DIAGNOSIS — L03319 Cellulitis of trunk, unspecified: Secondary | ICD-10-CM | POA: Diagnosis not present

## 2023-01-28 DIAGNOSIS — L02211 Cutaneous abscess of abdominal wall: Secondary | ICD-10-CM | POA: Diagnosis not present

## 2023-01-29 ENCOUNTER — Inpatient Hospital Stay (HOSPITAL_COMMUNITY)
Admission: EM | Admit: 2023-01-29 | Discharge: 2023-02-04 | DRG: 623 | Disposition: A | Discharge status: Home / Self Care | Payer: Medicare HMO | Attending: Internal Medicine | Admitting: Internal Medicine

## 2023-01-29 ENCOUNTER — Other Ambulatory Visit: Payer: Self-pay

## 2023-01-29 ENCOUNTER — Emergency Department (HOSPITAL_COMMUNITY): Payer: Medicare HMO

## 2023-01-29 ENCOUNTER — Encounter (HOSPITAL_COMMUNITY): Payer: Self-pay | Admitting: *Deleted

## 2023-01-29 DIAGNOSIS — E11628 Type 2 diabetes mellitus with other skin complications: Principal | ICD-10-CM | POA: Diagnosis present

## 2023-01-29 DIAGNOSIS — Z79899 Other long term (current) drug therapy: Secondary | ICD-10-CM | POA: Diagnosis not present

## 2023-01-29 DIAGNOSIS — F419 Anxiety disorder, unspecified: Secondary | ICD-10-CM | POA: Diagnosis present

## 2023-01-29 DIAGNOSIS — S30861A Insect bite (nonvenomous) of abdominal wall, initial encounter: Secondary | ICD-10-CM | POA: Diagnosis present

## 2023-01-29 DIAGNOSIS — E1152 Type 2 diabetes mellitus with diabetic peripheral angiopathy with gangrene: Secondary | ICD-10-CM | POA: Diagnosis present

## 2023-01-29 DIAGNOSIS — Z87442 Personal history of urinary calculi: Secondary | ICD-10-CM

## 2023-01-29 DIAGNOSIS — L0390 Cellulitis, unspecified: Secondary | ICD-10-CM | POA: Diagnosis present

## 2023-01-29 DIAGNOSIS — K219 Gastro-esophageal reflux disease without esophagitis: Secondary | ICD-10-CM | POA: Diagnosis present

## 2023-01-29 DIAGNOSIS — L03311 Cellulitis of abdominal wall: Principal | ICD-10-CM | POA: Diagnosis present

## 2023-01-29 DIAGNOSIS — L02211 Cutaneous abscess of abdominal wall: Secondary | ICD-10-CM | POA: Diagnosis present

## 2023-01-29 DIAGNOSIS — F32A Depression, unspecified: Secondary | ICD-10-CM | POA: Diagnosis present

## 2023-01-29 DIAGNOSIS — F418 Other specified anxiety disorders: Secondary | ICD-10-CM | POA: Diagnosis not present

## 2023-01-29 DIAGNOSIS — E1165 Type 2 diabetes mellitus with hyperglycemia: Secondary | ICD-10-CM | POA: Diagnosis present

## 2023-01-29 DIAGNOSIS — Z6832 Body mass index (BMI) 32.0-32.9, adult: Secondary | ICD-10-CM | POA: Diagnosis not present

## 2023-01-29 DIAGNOSIS — F329 Major depressive disorder, single episode, unspecified: Secondary | ICD-10-CM | POA: Diagnosis present

## 2023-01-29 DIAGNOSIS — Z803 Family history of malignant neoplasm of breast: Secondary | ICD-10-CM | POA: Diagnosis not present

## 2023-01-29 DIAGNOSIS — G4700 Insomnia, unspecified: Secondary | ICD-10-CM | POA: Diagnosis present

## 2023-01-29 DIAGNOSIS — Z87891 Personal history of nicotine dependence: Secondary | ICD-10-CM

## 2023-01-29 DIAGNOSIS — L03319 Cellulitis of trunk, unspecified: Secondary | ICD-10-CM | POA: Diagnosis not present

## 2023-01-29 DIAGNOSIS — N281 Cyst of kidney, acquired: Secondary | ICD-10-CM | POA: Diagnosis not present

## 2023-01-29 DIAGNOSIS — Z8042 Family history of malignant neoplasm of prostate: Secondary | ICD-10-CM

## 2023-01-29 DIAGNOSIS — E66811 Obesity, class 1: Secondary | ICD-10-CM | POA: Diagnosis present

## 2023-01-29 DIAGNOSIS — E785 Hyperlipidemia, unspecified: Secondary | ICD-10-CM | POA: Diagnosis present

## 2023-01-29 DIAGNOSIS — L02219 Cutaneous abscess of trunk, unspecified: Secondary | ICD-10-CM

## 2023-01-29 DIAGNOSIS — I10 Essential (primary) hypertension: Secondary | ICD-10-CM | POA: Diagnosis not present

## 2023-01-29 DIAGNOSIS — X58XXXA Exposure to other specified factors, initial encounter: Secondary | ICD-10-CM | POA: Diagnosis present

## 2023-01-29 DIAGNOSIS — E119 Type 2 diabetes mellitus without complications: Secondary | ICD-10-CM | POA: Diagnosis not present

## 2023-01-29 DIAGNOSIS — F331 Major depressive disorder, recurrent, moderate: Secondary | ICD-10-CM | POA: Diagnosis present

## 2023-01-29 LAB — COMPREHENSIVE METABOLIC PANEL
ALT: 15 U/L (ref 0–44)
AST: 16 U/L (ref 15–41)
Albumin: 3.3 g/dL — ABNORMAL LOW (ref 3.5–5.0)
Alkaline Phosphatase: 57 U/L (ref 38–126)
Anion gap: 11 (ref 5–15)
BUN: 12 mg/dL (ref 8–23)
CO2: 25 mmol/L (ref 22–32)
Calcium: 9.4 mg/dL (ref 8.9–10.3)
Chloride: 101 mmol/L (ref 98–111)
Creatinine, Ser: 1.07 mg/dL — ABNORMAL HIGH (ref 0.44–1.00)
GFR, Estimated: 56 mL/min — ABNORMAL LOW (ref >60)
Glucose, Bld: 138 mg/dL — ABNORMAL HIGH (ref 70–99)
Potassium: 4 mmol/L (ref 3.5–5.1)
Sodium: 137 mmol/L (ref 135–145)
Total Bilirubin: 0.6 mg/dL (ref 0.0–1.2)
Total Protein: 6.5 g/dL (ref 6.5–8.1)

## 2023-01-29 LAB — CBC
HCT: 39.6 % (ref 36.0–46.0)
Hemoglobin: 13.3 g/dL (ref 12.0–15.0)
MCH: 28.8 pg (ref 26.0–34.0)
MCHC: 33.6 g/dL (ref 30.0–36.0)
MCV: 85.7 fL (ref 80.0–100.0)
Platelets: 229 10*3/uL (ref 150–400)
RBC: 4.62 MIL/uL (ref 3.87–5.11)
RDW: 12.5 % (ref 11.5–15.5)
WBC: 9.3 10*3/uL (ref 4.0–10.5)
nRBC: 0 % (ref 0.0–0.2)

## 2023-01-29 LAB — URINALYSIS, ROUTINE W REFLEX MICROSCOPIC
Bilirubin Urine: NEGATIVE
Glucose, UA: NEGATIVE mg/dL
Hgb urine dipstick: NEGATIVE
Ketones, ur: 5 mg/dL — AB
Nitrite: NEGATIVE
Protein, ur: NEGATIVE mg/dL
Specific Gravity, Urine: 1.016 (ref 1.005–1.030)
WBC, UA: >50 WBC/hpf (ref 0–5)
pH: 5 (ref 5.0–8.0)

## 2023-01-29 LAB — I-STAT CG4 LACTIC ACID, ED: Lactic Acid, Venous: 0.9 mmol/L (ref 0.5–1.9)

## 2023-01-29 LAB — LIPASE, BLOOD: Lipase: 39 U/L (ref 11–51)

## 2023-01-29 MED ORDER — VANCOMYCIN HCL 1.5 G IV SOLR
1500.0000 mg | Freq: Once | INTRAVENOUS | Status: DC
  Filled 2023-01-29: qty 30

## 2023-01-29 MED ORDER — LACTATED RINGERS IV SOLN: INTRAVENOUS | Status: AC

## 2023-01-29 MED ORDER — VANCOMYCIN HCL 1500 MG/300ML IV SOLN
1500.0000 mg | Freq: Once | INTRAVENOUS | Status: AC
  Administered 2023-01-29: 1500 mg via INTRAVENOUS
  Filled 2023-01-29: qty 300

## 2023-01-29 MED ORDER — VANCOMYCIN HCL 10 G IV SOLR: 1500.0000 mg | Freq: Once | INTRAVENOUS | Status: DC

## 2023-01-29 MED ORDER — SODIUM CHLORIDE 0.9 % IV SOLN
2.0000 g | Freq: Every day | INTRAVENOUS | Status: DC
  Administered 2023-01-30 – 2023-01-31 (×2): 2 g via INTRAVENOUS
  Filled 2023-01-29 (×2): qty 20

## 2023-01-29 MED ORDER — HYDROMORPHONE HCL 1 MG/ML IJ SOLN
0.5000 mg | Freq: Once | INTRAMUSCULAR | Status: AC
  Administered 2023-01-29: 0.5 mg via INTRAVENOUS
  Filled 2023-01-29: qty 1

## 2023-01-29 MED ORDER — SODIUM CHLORIDE 0.9 % IV SOLN
1.0000 g | Freq: Once | INTRAVENOUS | Status: AC
  Administered 2023-01-29: 1 g via INTRAVENOUS
  Filled 2023-01-29: qty 10

## 2023-01-29 MED ORDER — VANCOMYCIN HCL IN DEXTROSE 1-5 GM/200ML-% IV SOLN: 1000.0000 mg | Freq: Once | INTRAVENOUS | Status: DC

## 2023-01-29 NOTE — ED Triage Notes (Signed)
 The pt had a lesion removed from her rt lower abd yesterday in a doctors office under local anesthesia/  today she has had a temp and there is much redness surrounding the wound

## 2023-01-29 NOTE — ED Notes (Signed)
 Assisted pt to restroom via wheelchair.

## 2023-01-29 NOTE — ED Provider Notes (Signed)
 [place] EMERGENCY DEPARTMENT AT [facility] Provider Note   CSN: [id number] Arrival date & time: 01/29/23  1716     History  Chief Complaint  Patient presents with   Abdominal Pain    Karla Watson is a 73 y.o. female.  HPI Patient reports that she developed a nodule on her right abdominal wall 3 days ago.  She has send she had a spider bite.  It continued to get redder and more swollen.  She saw her doctor yesterday and they incised it and started her on Augmentin.  She reports she took a dose of Augmentin but today the redness expanded and pain became much more intense.  Patient reports she has had close to a low-grade fever in the high 90s and had some chills.  She reports her doctor recommended she come to the emergency department for further evaluation.  No nausea or vomiting.  Patient does have history of diabetes.  No prior history of abscesses or wounds.    Home Medications Prior to Admission medications   Medication Sig Start Date End Date Taking? Authorizing Provider  atorvastatin  (LIPITOR) 10 MG tablet Take 10 mg by mouth daily. 01/08/23  Yes [provider]  BACTRIM DS 800-160 MG tablet Take 1 tablet by mouth 2 (two) times daily. 01/28/23  Yes [provider]  buPROPion  (WELLBUTRIN  XL) 300 MG 24 hr tablet Take 300 mg by mouth daily.   Yes [provider]  cholecalciferol (VITAMIN D3) 25 MCG (1000 UNIT) tablet Take 1,000 Units by mouth daily.   Yes [provider]  FLUoxetine  (PROZAC ) 40 MG capsule Take 40 mg by mouth daily.   Yes [provider]  Multiple Vitamin (MULTIVITAMIN) tablet Take 1 tablet by mouth daily.   Yes [provider]  omeprazole (PRILOSEC) 20 MG capsule Take 20 mg by mouth daily.   Yes [provider]  traZODone  (DESYREL ) 100 MG tablet Take 100 mg by mouth at bedtime.   Yes [provider]      Allergies    Erythromycin    Review of Systems   Review of  Systems  Physical Exam Updated Vital Signs BP (!) 142/56 (BP Location: Right Arm)   Pulse 65   Temp 99.6 F (37.6 C) (Oral)   Resp 19   Ht 5' 3 (1.6 m)   Wt 82.1 kg   LMP 01/20/2007   SpO2 97%   BMI 32.06 kg/m  Physical Exam Constitutional:      Appearance: Normal appearance.  HENT:     Mouth/Throat:     Pharynx: Oropharynx is clear.  Eyes:     Extraocular Movements: Extraocular movements intact.  Cardiovascular:     Rate and Rhythm: Normal rate and regular rhythm.  Pulmonary:     Effort: Pulmonary effort is normal.     Breath sounds: Normal breath sounds.  Abdominal:     Comments: Abdomen is exquisitely tender around indurated erythematous nodular area as seen in image.  Patient has abdominal wall cellulitis about 10+ centimeters with central eschar.  No guarding.  Musculoskeletal:        General: Normal range of motion.     Right lower leg: No edema.     Left lower leg: No edema.  Skin:    General: Skin is warm and dry.  Neurological:     General: No focal deficit present.     Mental Status: She is alert and oriented to person, place, and time.  Coordination: Coordination normal.  Psychiatric:        Mood and Affect: Mood normal.      ED Results / Procedures / Treatments   Labs (all labs ordered are listed, but only abnormal results are displayed) Labs Reviewed  COMPREHENSIVE METABOLIC PANEL - Abnormal; Notable for the following components:      Result Value   Glucose, Bld 138 (*)    Creatinine, Ser 1.07 (*)    Albumin 3.3 (*)    GFR, Estimated 56 (*)    All other components within normal limits  URINALYSIS, ROUTINE W REFLEX MICROSCOPIC - Abnormal; Notable for the following components:   APPearance HAZY (*)    Ketones, ur 5 (*)    Leukocytes,Ua LARGE (*)    Bacteria, UA FEW (*)    All other components within normal limits  CULTURE, BLOOD (ROUTINE X 2)  CULTURE, BLOOD (ROUTINE X 2)  LIPASE, BLOOD  CBC  HIV ANTIBODY (ROUTINE TESTING W REFLEX)   CBC  CREATININE, SERUM  CBC  COMPREHENSIVE METABOLIC PANEL  I-STAT CG4 LACTIC ACID, ED  I-STAT CG4 LACTIC ACID, ED    EKG None  Radiology CT ABDOMEN PELVIS WO CONTRAST Result Date: 01/29/2023 CLINICAL DATA:  Abdominal pain, right abdominal wound (spider bite), evaluate for abdominal wall abscess EXAM: CT ABDOMEN AND PELVIS WITHOUT CONTRAST TECHNIQUE: Multidetector CT imaging of the abdomen and pelvis was performed following the standard protocol without IV contrast. RADIATION DOSE REDUCTION: This exam was performed according to the departmental dose-optimization program which includes automated exposure control, adjustment of the mA and/or kV according to patient size and/or use of iterative reconstruction technique. COMPARISON:  None Available. FINDINGS: Lower chest: Lung bases are clear. Hepatobiliary: Liver is within normal limits. Gallbladder is unremarkable. No intrahepatic or extrahepatic duct dilatation. Pancreas: Within normal limits. Spleen: Within normal limits. Adrenals/Urinary Tract: 14 mm right adrenal nodule (series 3/image 21), measuring less than 10 HUs, compatible with a benign adrenal adenoma. Left adrenal gland is within normal limits. 2.1 cm simple right upper pole renal cyst (series 3/image 32), benign (Bosniak I). Two nonobstructing left renal calculi measuring up to 3 mm (coronal image 51). No ureteral or bladder calculi. No hydronephrosis. Bladder is within normal limits. Stomach/Bowel: Stomach is notable for a small hiatal hernia. No evidence of bowel obstruction. Normal appendix (series 3/image 51). Mild left colonic diverticulosis, without evidence of diverticulitis. Vascular/Lymphatic: No evidence of abdominal aortic aneurysm. Atherosclerotic calcifications of the abdominal aorta and branch vessels. No suspicious abdominopelvic lymphadenopathy. Reproductive: Uterus is within normal limits. Left ovary is within normal limits.  No right adnexal mass. Other: No abdominopelvic  ascites. Cutaneous thickening along the right lateral anterior abdominal wall, with underlying mild stranding/phlegmonous changes (series 3/image 39), suggesting cellulitis. No discrete fluid collection/abscess. Musculoskeletal: Mild degenerative changes of the lumbar spine, most prominent at L5-S1. IMPRESSION: Right lateral abdominal wall cellulitis. No discrete fluid collection/abscess. Additional ancillary findings as above. Electronically Signed   By: Pinkie Pebbles M.D.   On: 01/29/2023 20:43    Procedures Procedures    Medications Ordered in ED Medications  lactated ringers  infusion ( Intravenous New Bag/Given 01/29/23 2054)  cefTRIAXone  (ROCEPHIN ) 2 g in sodium chloride  0.9 % 100 mL IVPB (has no administration in time range)  atorvastatin  (LIPITOR) tablet 10 mg (has no administration in time range)  buPROPion  (WELLBUTRIN  XL) 24 hr tablet 300 mg (has no administration in time range)  FLUoxetine  (PROZAC ) capsule 40 mg (has no administration in time range)  traZODone  (DESYREL ) tablet  100 mg (has no administration in time range)  pantoprazole  (PROTONIX ) EC tablet 40 mg (has no administration in time range)  enoxaparin  (LOVENOX ) injection 40 mg (has no administration in time range)  acetaminophen  (TYLENOL ) tablet 650 mg (has no administration in time range)    Or  acetaminophen  (TYLENOL ) suppository 650 mg (has no administration in time range)  oxyCODONE  (Oxy IR/ROXICODONE ) immediate release tablet 5 mg (has no administration in time range)  ondansetron  (ZOFRAN ) tablet 4 mg (has no administration in time range)    Or  ondansetron  (ZOFRAN ) injection 4 mg (has no administration in time range)  cefTRIAXone  (ROCEPHIN ) 1 g in sodium chloride  0.9 % 100 mL IVPB (0 g Intravenous Stopped 01/29/23 2144)  HYDROmorphone  (DILAUDID ) injection 0.5 mg (0.5 mg Intravenous Given 01/29/23 2052)  vancomycin  (VANCOREADY) IVPB 1500 mg/300 mL (1,500 mg Intravenous New Bag/Given 01/29/23 2146)    ED Course/  Medical Decision Making/ A&P                                 Medical Decision Making Amount and/or Complexity of Data Reviewed Radiology: ordered.  Risk Prescription drug management. Decision regarding hospitalization.   Patient has history of diabetes.  She presents with an abdominal wall infection.  It appears to be cellulitic.  It has been incised and outpatient basis but has central eschar.  Will proceed with CT scan to rule out deeper space abscess and extent of cellulitic changes.  Lactic acid 0.9 white count 9.3 normal H&H blood sugar 138 GFR 56  CT abdomen interpreted by radiology no identifiable fluid collection or abscess.  Cellulitic changes of abdominal wall.  At this time with advancing significant cellulitis and induration and diabetic patient will plan for IV antibiotics and admission.  Patient also has significant pain.  Pain control with Dilaudid  administered.  Rocephin  and vancomycin  administered in the emergency department.  Consult [facility] hospitalist for admission Dr. Dena.        Final Clinical Impression(s) / ED Diagnoses Final diagnoses:  Abdominal wall cellulitis  Type 2 diabetes mellitus with other skin complication, unspecified whether long term insulin use (HCC)    Rx / DC Orders ED Discharge Orders     None         Armenta Canning, MD 01/30/23 0025

## 2023-01-29 NOTE — ED Triage Notes (Signed)
 Pt also c/o a headache and dizziness pulse sl irregular

## 2023-01-29 NOTE — ED Notes (Signed)
 Patient has a bite/packed wound on the outer of her right abdomen. Stated it started as a spider bite, but increased in swelling and pain. Went to DR yesterday for I&D, packed with dressing, states it has increased in pain and redness.

## 2023-01-30 DIAGNOSIS — K219 Gastro-esophageal reflux disease without esophagitis: Secondary | ICD-10-CM | POA: Diagnosis present

## 2023-01-30 DIAGNOSIS — F329 Major depressive disorder, single episode, unspecified: Secondary | ICD-10-CM | POA: Diagnosis present

## 2023-01-30 DIAGNOSIS — Z87891 Personal history of nicotine dependence: Secondary | ICD-10-CM | POA: Diagnosis not present

## 2023-01-30 DIAGNOSIS — X58XXXA Exposure to other specified factors, initial encounter: Secondary | ICD-10-CM | POA: Diagnosis present

## 2023-01-30 DIAGNOSIS — F418 Other specified anxiety disorders: Secondary | ICD-10-CM | POA: Diagnosis not present

## 2023-01-30 DIAGNOSIS — Z803 Family history of malignant neoplasm of breast: Secondary | ICD-10-CM | POA: Diagnosis not present

## 2023-01-30 DIAGNOSIS — Z8042 Family history of malignant neoplasm of prostate: Secondary | ICD-10-CM | POA: Diagnosis not present

## 2023-01-30 DIAGNOSIS — L0390 Cellulitis, unspecified: Secondary | ICD-10-CM | POA: Diagnosis present

## 2023-01-30 DIAGNOSIS — L02219 Cutaneous abscess of trunk, unspecified: Secondary | ICD-10-CM | POA: Diagnosis not present

## 2023-01-30 DIAGNOSIS — E66811 Obesity, class 1: Secondary | ICD-10-CM | POA: Diagnosis present

## 2023-01-30 DIAGNOSIS — E1165 Type 2 diabetes mellitus with hyperglycemia: Secondary | ICD-10-CM | POA: Diagnosis present

## 2023-01-30 DIAGNOSIS — Z79899 Other long term (current) drug therapy: Secondary | ICD-10-CM | POA: Diagnosis not present

## 2023-01-30 DIAGNOSIS — L02211 Cutaneous abscess of abdominal wall: Secondary | ICD-10-CM | POA: Diagnosis present

## 2023-01-30 DIAGNOSIS — E1152 Type 2 diabetes mellitus with diabetic peripheral angiopathy with gangrene: Secondary | ICD-10-CM | POA: Diagnosis present

## 2023-01-30 DIAGNOSIS — S30861A Insect bite (nonvenomous) of abdominal wall, initial encounter: Secondary | ICD-10-CM | POA: Diagnosis present

## 2023-01-30 DIAGNOSIS — L03311 Cellulitis of abdominal wall: Secondary | ICD-10-CM | POA: Diagnosis present

## 2023-01-30 DIAGNOSIS — E785 Hyperlipidemia, unspecified: Secondary | ICD-10-CM | POA: Diagnosis present

## 2023-01-30 DIAGNOSIS — E11628 Type 2 diabetes mellitus with other skin complications: Secondary | ICD-10-CM | POA: Diagnosis present

## 2023-01-30 DIAGNOSIS — G4700 Insomnia, unspecified: Secondary | ICD-10-CM | POA: Diagnosis present

## 2023-01-30 DIAGNOSIS — F419 Anxiety disorder, unspecified: Secondary | ICD-10-CM | POA: Diagnosis present

## 2023-01-30 DIAGNOSIS — Z6832 Body mass index (BMI) 32.0-32.9, adult: Secondary | ICD-10-CM | POA: Diagnosis not present

## 2023-01-30 DIAGNOSIS — E119 Type 2 diabetes mellitus without complications: Secondary | ICD-10-CM | POA: Diagnosis not present

## 2023-01-30 DIAGNOSIS — Z87442 Personal history of urinary calculi: Secondary | ICD-10-CM | POA: Diagnosis not present

## 2023-01-30 DIAGNOSIS — L03319 Cellulitis of trunk, unspecified: Secondary | ICD-10-CM | POA: Diagnosis not present

## 2023-01-30 LAB — COMPREHENSIVE METABOLIC PANEL
ALT: 12 U/L (ref 0–44)
AST: 12 U/L — ABNORMAL LOW (ref 15–41)
Albumin: 2.9 g/dL — ABNORMAL LOW (ref 3.5–5.0)
Alkaline Phosphatase: 52 U/L (ref 38–126)
Anion gap: 10 (ref 5–15)
BUN: 9 mg/dL (ref 8–23)
CO2: 26 mmol/L (ref 22–32)
Calcium: 8.6 mg/dL — ABNORMAL LOW (ref 8.9–10.3)
Chloride: 103 mmol/L (ref 98–111)
Creatinine, Ser: 1.12 mg/dL — ABNORMAL HIGH (ref 0.44–1.00)
GFR, Estimated: 53 mL/min — ABNORMAL LOW (ref >60)
Glucose, Bld: 131 mg/dL — ABNORMAL HIGH (ref 70–99)
Potassium: 4 mmol/L (ref 3.5–5.1)
Sodium: 139 mmol/L (ref 135–145)
Total Bilirubin: 0.9 mg/dL (ref 0.0–1.2)
Total Protein: 5.6 g/dL — ABNORMAL LOW (ref 6.5–8.1)

## 2023-01-30 LAB — CBC
HCT: 35.8 % — ABNORMAL LOW (ref 36.0–46.0)
HCT: 35.2 % — ABNORMAL LOW (ref 36.0–46.0)
Hemoglobin: 12 g/dL (ref 12.0–15.0)
Hemoglobin: 11.8 g/dL — ABNORMAL LOW (ref 12.0–15.0)
MCH: 28.6 pg (ref 26.0–34.0)
MCH: 28.7 pg (ref 26.0–34.0)
MCHC: 33.5 g/dL (ref 30.0–36.0)
MCHC: 33.5 g/dL (ref 30.0–36.0)
MCV: 85.4 fL (ref 80.0–100.0)
MCV: 85.6 fL (ref 80.0–100.0)
Platelets: 198 10*3/uL (ref 150–400)
Platelets: 204 10*3/uL (ref 150–400)
RBC: 4.19 MIL/uL (ref 3.87–5.11)
RBC: 4.11 MIL/uL (ref 3.87–5.11)
RDW: 12.6 % (ref 11.5–15.5)
RDW: 12.6 % (ref 11.5–15.5)
WBC: 9 10*3/uL (ref 4.0–10.5)
WBC: 7.7 10*3/uL (ref 4.0–10.5)
nRBC: 0 % (ref 0.0–0.2)
nRBC: 0 % (ref 0.0–0.2)

## 2023-01-30 LAB — CREATININE, SERUM
Creatinine, Ser: 1.17 mg/dL — ABNORMAL HIGH (ref 0.44–1.00)
GFR, Estimated: 50 mL/min — ABNORMAL LOW (ref >60)

## 2023-01-30 LAB — HIV ANTIBODY (ROUTINE TESTING W REFLEX): HIV Screen 4th Generation wRfx: NONREACTIVE

## 2023-01-30 MED ORDER — ATORVASTATIN CALCIUM 10 MG PO TABS
10.0000 mg | ORAL_TABLET | Freq: Every day | ORAL | Status: DC
Start: 2023-01-30 — End: 2023-02-04
  Administered 2023-01-30 – 2023-02-04 (×6): 10 mg via ORAL
  Filled 2023-01-30 (×6): qty 1

## 2023-01-30 MED ORDER — OXYCODONE HCL 5 MG PO TABS
5.0000 mg | ORAL_TABLET | ORAL | Status: DC | PRN
  Administered 2023-01-30 – 2023-01-31 (×3): 5 mg via ORAL
  Filled 2023-01-30 (×4): qty 1

## 2023-01-30 MED ORDER — ONDANSETRON HCL 4 MG PO TABS: 4.0000 mg | ORAL_TABLET | Freq: Four times a day (QID) | ORAL | Status: DC | PRN

## 2023-01-30 MED ORDER — ONDANSETRON HCL 4 MG/2ML IJ SOLN
4.0000 mg | Freq: Four times a day (QID) | INTRAMUSCULAR | Status: DC | PRN
Start: 2023-01-30 — End: 2023-02-04
  Administered 2023-02-01: 4 mg via INTRAVENOUS

## 2023-01-30 MED ORDER — PANTOPRAZOLE SODIUM 40 MG PO TBEC
40.0000 mg | DELAYED_RELEASE_TABLET | Freq: Every day | ORAL | Status: DC
  Administered 2023-01-30 – 2023-02-04 (×6): 40 mg via ORAL
  Filled 2023-01-30 (×6): qty 1

## 2023-01-30 MED ORDER — BUPROPION HCL ER (XL) 150 MG PO TB24
300.0000 mg | ORAL_TABLET | Freq: Every day | ORAL | Status: DC
  Administered 2023-01-30 – 2023-02-04 (×6): 300 mg via ORAL
  Filled 2023-01-30 (×6): qty 2

## 2023-01-30 MED ORDER — ACETAMINOPHEN 650 MG RE SUPP: 650.0000 mg | Freq: Four times a day (QID) | RECTAL | Status: DC | PRN

## 2023-01-30 MED ORDER — FLUOXETINE HCL 20 MG PO CAPS
40.0000 mg | ORAL_CAPSULE | Freq: Every day | ORAL | Status: DC
Start: 2023-01-30 — End: 2023-02-04
  Administered 2023-01-30 – 2023-02-04 (×6): 40 mg via ORAL
  Filled 2023-01-30 (×6): qty 2

## 2023-01-30 MED ORDER — ENOXAPARIN SODIUM 40 MG/0.4ML IJ SOSY
40.0000 mg | PREFILLED_SYRINGE | Freq: Every day | INTRAMUSCULAR | Status: DC
  Administered 2023-01-30 – 2023-02-04 (×6): 40 mg via SUBCUTANEOUS
  Filled 2023-01-30 (×6): qty 0.4

## 2023-01-30 MED ORDER — ACETAMINOPHEN 325 MG PO TABS
650.0000 mg | ORAL_TABLET | Freq: Four times a day (QID) | ORAL | Status: DC | PRN
  Administered 2023-01-30 – 2023-02-02 (×2): 650 mg via ORAL
  Filled 2023-01-30 (×2): qty 2

## 2023-01-30 MED ORDER — TRAZODONE HCL 50 MG PO TABS
100.0000 mg | ORAL_TABLET | Freq: Every day | ORAL | Status: DC
  Administered 2023-01-30 – 2023-02-03 (×6): 100 mg via ORAL
  Filled 2023-01-30 (×6): qty 2

## 2023-01-30 NOTE — H&P (Signed)
 History and Physical    Corley Kohls FMW:[id number] DOB: Oct 27, 1950 DOA: 01/29/2023  PCP: Von Pinks, MD   Chief Complaint: Abdominal wall wound  HPI: Karla Watson is a 73 y.o. female with medical history significant of depression, renal cysts, nephrolithiasis who presents emergency department due to abdominal wall wound.  Patient states that she has had a right sided abdominal wall wound for the last 4 days.  She thought she had a central mark which was a bug bite however it continued to worsen and got bigger.  She saw her primary care doctor who started her on Augmentin without improvement in her symptoms.  Due to worsening pain, subjective fever she presented to the ER for further assessment.  On arrival she was afebrile and hemodynamically stable.  Labs were obtained on presentation which demonstrated creatinine 1.07 at baseline, WBC 9.3, hemoglobin 13.3, urinalysis with possible infection, blood cultures pending, lactic acid 0.9.  Patient underwent CT abdomen which demonstrated right lateral abdominal cellulitis, nonobstructing renal calculi and renal cysts.  Patient was started on ceftriaxone  and vancomycin  and admitted for further workup.  On admission she endorses persistent pain in her right abdominal wall.  She endorses some subjective fever and chills but denied other complaints.   Review of Systems: Review of Systems  Constitutional:  Positive for chills and fever.  HENT: Negative.    Eyes: Negative.   Respiratory: Negative.    Cardiovascular: Negative.  Negative for chest pain and palpitations.  Gastrointestinal: Negative.   Genitourinary: Negative.   Musculoskeletal: Negative.   Skin:  Positive for rash.  Neurological: Negative.   Endo/Heme/Allergies: Negative.   Psychiatric/Behavioral: Negative.       As per HPI otherwise 10 point review of systems negative.   Allergies  Allergen Reactions   Erythromycin Nausea And Vomiting    Past Medical History:   Diagnosis Date   Arthritis    oa hands and left foot   Depression    History of kidney stones    Insomnia    Kidney abscess 2012   Pleurisy 12/2018   tx with antibiotics   Sepsis (HCC)    in [place] from 03-07-2020 to 03-09-2020   Urinary frequency    Vertigo occ   Wears glasses     Past Surgical History:  Procedure Laterality Date   CATARACT EXTRACTION Bilateral yrs ago   CEREBRAL ANEURYSM REPAIR  15 yrs ago   no problems after coiling done   CYSTOSCOPY W/ URETERAL STENT PLACEMENT Left 03/07/2020   Procedure: CYSTOSCOPY WITH RETROGRADE PYELOGRAM/URETERAL STENT PLACEMENT;  Surgeon: Selma Donnice SAUNDERS, MD;  Location: [facility] OR;  Service: Urology;  Laterality: Left;   CYSTOSCOPY/URETEROSCOPY/HOLMIUM LASER/STENT PLACEMENT Left 04/19/2020   Procedure: CYSTOSCOPY/RETROGRADE/URETEROSCOPY/HOLMIUM LASER/STENT PLACEMENT;  Surgeon: Selma Donnice SAUNDERS, MD;  Location: [facility];  Service: Urology;  Laterality: Left;  ONLY NEEDS 60 MIN   TUBAL LIGATION  yrs ago     reports that she quit smoking about 21 years ago. Her smoking use included cigarettes. She started smoking about 51 years ago. She has a 30 pack-year smoking history. She has never used smokeless tobacco. She reports that she does not currently use alcohol. She reports that she does not use drugs.  Family History  Problem Relation Age of Onset   Breast cancer Mother    Prostate cancer Father     Prior to Admission medications   Medication Sig Start Date End Date Taking? Authorizing Provider  atorvastatin  (LIPITOR) 10 MG tablet Take 10 mg by  mouth daily. 01/08/23  Yes [provider]  BACTRIM DS 800-160 MG tablet Take 1 tablet by mouth 2 (two) times daily. 01/28/23  Yes [provider]  buPROPion  (WELLBUTRIN  XL) 300 MG 24 hr tablet Take 300 mg by mouth daily.   Yes [provider]  cholecalciferol (VITAMIN D3) 25 MCG (1000 UNIT) tablet Take 1,000 Units by mouth daily.   Yes [provider]   FLUoxetine  (PROZAC ) 40 MG capsule Take 40 mg by mouth daily.   Yes [provider]  Multiple Vitamin (MULTIVITAMIN) tablet Take 1 tablet by mouth daily.   Yes [provider]  omeprazole (PRILOSEC) 20 MG capsule Take 20 mg by mouth daily.   Yes [provider]  traZODone  (DESYREL ) 100 MG tablet Take 100 mg by mouth at bedtime.   Yes [provider]    Physical Exam: Vitals:   01/29/23 1744 01/29/23 1900 01/29/23 2100 01/29/23 2204  BP: 107/86 (!) 176/75 135/80   Pulse: 72 67 67   Resp: 18 (!) 23    Temp: 99.1 F (37.3 C)   99 F (37.2 C)  TempSrc:    Oral  SpO2: 97% 95% 92%   Weight:      Height:       Physical Exam Constitutional:      Appearance: She is normal weight.  HENT:     Head: Normocephalic.     Mouth/Throat:     Mouth: Mucous membranes are moist.  Cardiovascular:     Rate and Rhythm: Normal rate and regular rhythm.  Pulmonary:     Effort: Pulmonary effort is normal.     Breath sounds: Normal breath sounds.  Abdominal:     General: Abdomen is flat. Bowel sounds are normal.     Palpations: Abdomen is soft.  Skin:    General: Skin is warm and dry.     Capillary Refill: Capillary refill takes less than 2 seconds.  Neurological:     General: No focal deficit present.     Mental Status: She is alert.        Labs on Admission: I have personally reviewed the patients's labs and imaging studies.  Assessment/Plan Principal Problem:   Cellulitis Active Problems:   Cellulitis of abdominal wall   # Abdominal wall cellulitis - Patient presenting with right-sided abdominal wall pain - No leukocytosis - Given outpatient trial of Augmentin without improvement - CT findings of skin thickening  Plan: Continue ceftriaxone  Repeat labs in morning Follow-up blood cultures  # Depression-continue bupropion , Prozac   # Insomnia-continue trazodone   # GERD-continue Protonix   # Hyperlipidemia-continue  Admission status:  Observation Med-Surg  Certification: The appropriate patient status for this patient is OBSERVATION. Observation status is judged to be reasonable and necessary in order to provide the required intensity of service to ensure the patient's safety. The patient's presenting symptoms, physical exam findings, and initial radiographic and laboratory data in the context of their medical condition is felt to place them at decreased risk for further clinical deterioration. Furthermore, it is anticipated that the patient will be medically stable for discharge from the hospital within 2 midnights of admission.     Lamar Dess MD [facility] If 7PM-7AM, please contact night-coverage [email or web address]  01/30/2023, 12:02 AM

## 2023-01-30 NOTE — Care Plan (Signed)
 This 73 years old female with PMH significant for depression, renal cysts, nephrolithiasis presented in the ED with abdominal wall wound for last 4 days. Patient has developed a small mark in the right side of her abdomen,  She thought it is a bug bite, it continued to get worse and got bigger.  She has seen her primary care physician who has started her on Augmentin but has not seen any improvement.  Later patient has developed fever,  worsening pain.  Patient came to the ED.  CT abdomen and pelvis showed right-sided lateral cellulitis, nonobstructing renal calculi , renal cyst.  Patient started on vancomycin  and ceftriaxone  and admitted for further evaluation.  Patient was seen and examined at bedside.

## 2023-01-30 NOTE — Plan of Care (Signed)
 Pt alert x4, independent/standby, awaiting to see if plan is to go home on antibiotics or another I&D of the abd wound,

## 2023-01-31 DIAGNOSIS — L03311 Cellulitis of abdominal wall: Secondary | ICD-10-CM | POA: Diagnosis not present

## 2023-01-31 MED ORDER — OXYCODONE HCL 5 MG PO TABS
10.0000 mg | ORAL_TABLET | ORAL | Status: DC | PRN
  Administered 2023-01-31 – 2023-02-01 (×3): 10 mg via ORAL
  Filled 2023-01-31 (×3): qty 2

## 2023-01-31 NOTE — Consult Note (Signed)
 WOC Nurse Consult Note: Reason for Consult: wound care Patient had I&D 01/28/23 in PCP office, antibiotics started, unclear if patient had envenomation or otherwise  Wound type: infectious  Pressure Injury POA Measurement: see nursing flow sheets Wound bed: ruddy, pink, moist tissue Drainage (amount, consistency, odor) none documented Periwound: large area of surrounding erythema  Dressing procedure/placement/frequency: Cleanse abdominal wound with Vashe, pat dry, pack with 1/4 iodoform gauze, top with foam Change daily  Consider surgical evaluation for opening area if continues to persist and/or worsen.    Re consult if needed, will not follow at this time. Thanks  Gift Rueckert [employer], RN,CWOCN, CNS, CWON-AP [phone])

## 2023-01-31 NOTE — Progress Notes (Signed)
 PROGRESS NOTE    Karla Watson  FMW:[id number] DOB: 08-11-1950 DOA: 01/29/2023 PCP: Von Pinks, MD  Brief Narrative:  This 73 years old female with PMH significant for depression, renal cysts, nephrolithiasis presented in the ED with abdominal wall wound for last 4 days. Patient has developed a small mark in the right side of her abdomen,  She thought it is a bug bite, it continued to get worse and got bigger.  She has seen her primary care physician who has started her on Augmentin but has not seen any improvement.  Later patient has developed fever,  worsening pain.  Patient came to the ED.  CT abdomen and pelvis showed right-sided lateral cellulitis, nonobstructing renal calculi , renal cyst.  Patient started on vancomycin  and ceftriaxone  and admitted for further evaluation.   Assessment & Plan:   Principal Problem:   Cellulitis Active Problems:   Cellulitis of abdominal wall  Abdominal wall cellulitis: Patient presented with right-sided abdominal wall pain,  redness,  tenderness and swelling. There is no leukocytosis, remains afebrile. Patient was prescribed Augmentin without any improvement as an outpatient. CT abdomen ruled out abscess,  findings consistent with cellulitis. Continue empiric antibiotics ceftriaxone . Continue adequate pain control. Wound care consulted Follow blood cultures.  Depression: Continue bupropion  and Prozac .  Insomnia: Continue trazodone .  GERD: Continue pantoprazole .  Hyperlipidemia: Continue Lipitor  DVT prophylaxis: Lovenox  Code Status: Full code Family Communication: No family at bedside. Disposition Plan:    Status is: Inpatient Remains inpatient appropriate because: Admitted for abdominal wall cellulitis.   Consultants:  None  Procedures: CT Abdomen  Antimicrobials:  Anti-infectives (From admission, onward)    Start     Dose/Rate Route Frequency Ordered Stop   01/30/23 1000  cefTRIAXone  (ROCEPHIN ) 2 g in sodium  chloride 0.9 % 100 mL IVPB        2 g 200 mL/hr over 30 Minutes Intravenous Daily 01/29/23 2359 02/06/23 0959   01/29/23 2045  vancomycin  (VANCOREADY) IVPB 1500 mg/300 mL        1,500 mg 150 mL/hr over 120 Minutes Intravenous  Once 01/29/23 2030 01/30/23 0134   01/29/23 2030  Vancomycin  (VANCOCIN ) 1,500 mg in sodium chloride  0.9 % 500 mL IVPB  Status:  Discontinued        1,500 mg 250 mL/hr over 120 Minutes Intravenous  Once 01/29/23 2024 01/29/23 2028   01/29/23 2030  vancomycin  (VANCOCIN ) 1,500 mg in sodium chloride  0.9 % 500 mL IVPB  Status:  Discontinued        1,500 mg 257.5 mL/hr over 120 Minutes Intravenous  Once 01/29/23 2028 01/29/23 2028   01/29/23 2015  vancomycin  (VANCOCIN ) IVPB 1000 mg/200 mL premix  Status:  Discontinued        1,000 mg 200 mL/hr over 60 Minutes Intravenous  Once 01/29/23 2008 01/29/23 2024   01/29/23 2015  cefTRIAXone  (ROCEPHIN ) 1 g in sodium chloride  0.9 % 100 mL IVPB        1 g 200 mL/hr over 30 Minutes Intravenous  Once 01/29/23 2008 01/29/23 2144       Subjective: Patient was seen and examined at bedside.  Overnight events noted.   Patient reports redness is improving but she still has significant amount of pain.  Objective: Vitals:   01/30/23 0747 01/30/23 1736 01/31/23 0418 01/31/23 0841  BP: (!) 130/92 (!) 108/57 (!) 133/58 (!) 154/70  Pulse: 63 60 (!) 58 (!) 58  Resp:  16 16 18   Temp: 99.2 F (37.3 C) 99 F (37.2  C) 98.7 F (37.1 C) 98.6 F (37 C)  TempSrc: Oral Oral Oral Oral  SpO2: 92% 97% 95% 95%  Weight:      Height:        Intake/Output Summary (Last 24 hours) at 01/31/2023 1139 Last data filed at 01/30/2023 1800 Gross per 24 hour  Intake 100 ml  Output --  Net 100 ml   Filed Weights   01/29/23 1739  Weight: 82.1 kg    Examination:  General exam: Appears calm and comfortable, not in any acute distress Respiratory system: Clear to auscultation. Respiratory effort normal.  RR 15 Cardiovascular system: S1 & S2 heard,  RRR. No JVD, murmurs, rubs, gallops or clicks.  Gastrointestinal system: Abdomen is non distended, soft and non tender.  Right lower abdomen erythematous, warm, tender,  normal bowel sounds heard. Central nervous system: Alert and oriented x 3. No focal neurological deficits. Extremities: No edema, no cyanosis, no clubbing Skin: No rashes, lesions or ulcers Psychiatry: Judgement and insight appear normal. Mood & affect appropriate.     Data Reviewed: I have personally reviewed following labs and imaging studies  CBC: Recent Labs  Lab 01/29/23 1749 01/30/23 0059 01/30/23 0634  WBC 9.3 9.0 7.7  HGB 13.3 12.0 11.8*  HCT 39.6 35.8* 35.2*  MCV 85.7 85.4 85.6  PLT 229 198 204   Basic Metabolic Panel: Recent Labs  Lab 01/29/23 1749 01/30/23 0059 01/30/23 0634  NA 137  --  139  K 4.0  --  4.0  CL 101  --  103  CO2 25  --  26  GLUCOSE 138*  --  131*  BUN 12  --  9  CREATININE 1.07* 1.17* 1.12*  CALCIUM  9.4  --  8.6*   GFR: Estimated Creatinine Clearance: 47.4 mL/min (A) (by C-G formula based on SCr of 1.12 mg/dL (H)). Liver Function Tests: Recent Labs  Lab 01/29/23 1749 01/30/23 0634  AST 16 12*  ALT 15 12  ALKPHOS 57 52  BILITOT 0.6 0.9  PROT 6.5 5.6*  ALBUMIN 3.3* 2.9*   Recent Labs  Lab 01/29/23 1749  LIPASE 39   No results for input(s): AMMONIA in the last 168 hours. Coagulation Profile: No results for input(s): INR, PROTIME in the last 168 hours. Cardiac Enzymes: No results for input(s): CKTOTAL, CKMB, CKMBINDEX, TROPONINI in the last 168 hours. BNP (last 3 results) No results for input(s): PROBNP in the last 8760 hours. HbA1C: No results for input(s): HGBA1C in the last 72 hours. CBG: No results for input(s): GLUCAP in the last 168 hours. Lipid Profile: No results for input(s): CHOL, HDL, LDLCALC, TRIG, CHOLHDL, LDLDIRECT in the last 72 hours. Thyroid Function Tests: No results for input(s): TSH, T4TOTAL,  FREET4, T3FREE, THYROIDAB in the last 72 hours. Anemia Panel: No results for input(s): VITAMINB12, FOLATE, FERRITIN, TIBC, IRON, RETICCTPCT in the last 72 hours. Sepsis Labs: Recent Labs  Lab 01/29/23 2055  LATICACIDVEN 0.9    Recent Results (from the past 240 hours)  Culture, blood (routine x 2)     Status: None (Preliminary result)   Collection Time: 01/29/23  8:07 PM   Specimen: BLOOD  Result Value Ref Range Status   Specimen Description BLOOD LEFT ANTECUBITAL  Final   Special Requests   Final    BOTTLES DRAWN AEROBIC AND ANAEROBIC Blood Culture results may not be optimal due to an inadequate volume of blood received in culture bottles   Culture   Final    NO GROWTH 2 DAYS Performed  at [facility], [address] [place], [place] [address]    Report Status PENDING  Incomplete  Culture, blood (routine x 2)     Status: None (Preliminary result)   Collection Time: 01/29/23  8:12 PM   Specimen: BLOOD  Result Value Ref Range Status   Specimen Description BLOOD BLOOD LEFT HAND  Final   Special Requests   Final    BOTTLES DRAWN AEROBIC AND ANAEROBIC Blood Culture adequate volume   Culture   Final    NO GROWTH 2 DAYS Performed at [facility], [address], [place], [place] [address]    Report Status PENDING  Incomplete    Radiology Studies: CT ABDOMEN PELVIS WO CONTRAST Result Date: 01/29/2023 CLINICAL DATA:  Abdominal pain, right abdominal wound (spider bite), evaluate for abdominal wall abscess EXAM: CT ABDOMEN AND PELVIS WITHOUT CONTRAST TECHNIQUE: Multidetector CT imaging of the abdomen and pelvis was performed following the standard protocol without IV contrast. RADIATION DOSE REDUCTION: This exam was performed according to the departmental dose-optimization program which includes automated exposure control, adjustment of the mA and/or kV according to patient size and/or use of iterative reconstruction technique. COMPARISON:  None Available.  FINDINGS: Lower chest: Lung bases are clear. Hepatobiliary: Liver is within normal limits. Gallbladder is unremarkable. No intrahepatic or extrahepatic duct dilatation. Pancreas: Within normal limits. Spleen: Within normal limits. Adrenals/Urinary Tract: 14 mm right adrenal nodule (series 3/image 21), measuring less than 10 HUs, compatible with a benign adrenal adenoma. Left adrenal gland is within normal limits. 2.1 cm simple right upper pole renal cyst (series 3/image 32), benign (Bosniak I). Two nonobstructing left renal calculi measuring up to 3 mm (coronal image 51). No ureteral or bladder calculi. No hydronephrosis. Bladder is within normal limits. Stomach/Bowel: Stomach is notable for a small hiatal hernia. No evidence of bowel obstruction. Normal appendix (series 3/image 51). Mild left colonic diverticulosis, without evidence of diverticulitis. Vascular/Lymphatic: No evidence of abdominal aortic aneurysm. Atherosclerotic calcifications of the abdominal aorta and branch vessels. No suspicious abdominopelvic lymphadenopathy. Reproductive: Uterus is within normal limits. Left ovary is within normal limits.  No right adnexal mass. Other: No abdominopelvic ascites. Cutaneous thickening along the right lateral anterior abdominal wall, with underlying mild stranding/phlegmonous changes (series 3/image 39), suggesting cellulitis. No discrete fluid collection/abscess. Musculoskeletal: Mild degenerative changes of the lumbar spine, most prominent at L5-S1. IMPRESSION: Right lateral abdominal wall cellulitis. No discrete fluid collection/abscess. Additional ancillary findings as above. Electronically Signed   By: Pinkie Pebbles M.D.   On: 01/29/2023 20:43   Scheduled Meds:  atorvastatin   10 mg Oral Daily   buPROPion   300 mg Oral Daily   enoxaparin  (LOVENOX ) injection  40 mg Subcutaneous Daily   FLUoxetine   40 mg Oral Daily   pantoprazole   40 mg Oral Daily   traZODone   100 mg Oral QHS   Continuous  Infusions:  cefTRIAXone  (ROCEPHIN )  IV 2 g (01/31/23 1033)     LOS: 1 day    Time spent: 35 mins    Darcel Dawley, MD [facility]   If 7PM-7AM, please contact night-coverage

## 2023-02-01 ENCOUNTER — Inpatient Hospital Stay (HOSPITAL_COMMUNITY): Payer: Medicare HMO | Admitting: Certified Registered"

## 2023-02-01 ENCOUNTER — Encounter (HOSPITAL_COMMUNITY)
Admission: EM | Disposition: A | Discharge status: Home / Self Care | Payer: Self-pay | Source: Home / Self Care | Attending: Internal Medicine

## 2023-02-01 ENCOUNTER — Other Ambulatory Visit: Payer: Self-pay

## 2023-02-01 ENCOUNTER — Encounter (HOSPITAL_COMMUNITY): Payer: Self-pay | Admitting: Internal Medicine

## 2023-02-01 DIAGNOSIS — E66811 Obesity, class 1: Secondary | ICD-10-CM | POA: Diagnosis not present

## 2023-02-01 DIAGNOSIS — E119 Type 2 diabetes mellitus without complications: Secondary | ICD-10-CM | POA: Diagnosis not present

## 2023-02-01 DIAGNOSIS — F418 Other specified anxiety disorders: Secondary | ICD-10-CM

## 2023-02-01 DIAGNOSIS — L02219 Cutaneous abscess of trunk, unspecified: Secondary | ICD-10-CM

## 2023-02-01 DIAGNOSIS — L03311 Cellulitis of abdominal wall: Secondary | ICD-10-CM | POA: Diagnosis not present

## 2023-02-01 DIAGNOSIS — Z6832 Body mass index (BMI) 32.0-32.9, adult: Secondary | ICD-10-CM | POA: Diagnosis not present

## 2023-02-01 DIAGNOSIS — L02211 Cutaneous abscess of abdominal wall: Secondary | ICD-10-CM | POA: Diagnosis not present

## 2023-02-01 HISTORY — PX: IRRIGATION AND DEBRIDEMENT ABSCESS: SHX5252

## 2023-02-01 LAB — CBC
HCT: 34.9 % — ABNORMAL LOW (ref 36.0–46.0)
Hemoglobin: 11.7 g/dL — ABNORMAL LOW (ref 12.0–15.0)
MCH: 28.5 pg (ref 26.0–34.0)
MCHC: 33.5 g/dL (ref 30.0–36.0)
MCV: 85.1 fL (ref 80.0–100.0)
Platelets: 220 10*3/uL (ref 150–400)
RBC: 4.1 MIL/uL (ref 3.87–5.11)
RDW: 12.3 % (ref 11.5–15.5)
WBC: 5.7 10*3/uL (ref 4.0–10.5)
nRBC: 0 % (ref 0.0–0.2)

## 2023-02-01 LAB — BASIC METABOLIC PANEL
Anion gap: 8 (ref 5–15)
BUN: 14 mg/dL (ref 8–23)
CO2: 27 mmol/L (ref 22–32)
Calcium: 8.8 mg/dL — ABNORMAL LOW (ref 8.9–10.3)
Chloride: 103 mmol/L (ref 98–111)
Creatinine, Ser: 1.2 mg/dL — ABNORMAL HIGH (ref 0.44–1.00)
GFR, Estimated: 49 mL/min — ABNORMAL LOW (ref >60)
Glucose, Bld: 117 mg/dL — ABNORMAL HIGH (ref 70–99)
Potassium: 3.9 mmol/L (ref 3.5–5.1)
Sodium: 138 mmol/L (ref 135–145)

## 2023-02-01 LAB — GLUCOSE, CAPILLARY
Glucose-Capillary: 71 mg/dL (ref 70–99)
Glucose-Capillary: 93 mg/dL (ref 70–99)
Glucose-Capillary: 91 mg/dL (ref 70–99)

## 2023-02-01 LAB — PHOSPHORUS: Phosphorus: 3.5 mg/dL (ref 2.5–4.6)

## 2023-02-01 LAB — MAGNESIUM: Magnesium: 2 mg/dL (ref 1.7–2.4)

## 2023-02-01 SURGERY — IRRIGATION AND DEBRIDEMENT ABSCESS
Anesthesia: General | Site: Abdomen

## 2023-02-01 MED ORDER — LACTATED RINGERS IV SOLN: INTRAVENOUS | Status: DC | PRN

## 2023-02-01 MED ORDER — LIDOCAINE 2% (20 MG/ML) 5 ML SYRINGE
INTRAMUSCULAR | Status: AC
  Filled 2023-02-01: qty 5

## 2023-02-01 MED ORDER — FENTANYL CITRATE (PF) 250 MCG/5ML IJ SOLN
INTRAMUSCULAR | Status: DC | PRN
  Administered 2023-02-01: 100 ug via INTRAVENOUS

## 2023-02-01 MED ORDER — 0.9 % SODIUM CHLORIDE (POUR BTL) OPTIME
TOPICAL | Status: DC | PRN
  Administered 2023-02-01: 1000 mL

## 2023-02-01 MED ORDER — SODIUM CHLORIDE 0.9 % IV SOLN: INTRAVENOUS | Status: DC

## 2023-02-01 MED ORDER — ORAL CARE MOUTH RINSE: 15.0000 mL | Freq: Once | OROMUCOSAL | Status: AC

## 2023-02-01 MED ORDER — ONDANSETRON HCL 4 MG/2ML IJ SOLN
INTRAMUSCULAR | Status: AC
  Filled 2023-02-01: qty 2

## 2023-02-01 MED ORDER — SUCCINYLCHOLINE CHLORIDE 200 MG/10ML IV SOSY
PREFILLED_SYRINGE | INTRAVENOUS | Status: DC | PRN
  Administered 2023-02-01: 100 mg via INTRAVENOUS

## 2023-02-01 MED ORDER — FENTANYL CITRATE (PF) 100 MCG/2ML IJ SOLN
25.0000 ug | INTRAMUSCULAR | Status: DC | PRN
  Administered 2023-02-01: 25 ug via INTRAVENOUS

## 2023-02-01 MED ORDER — PROPOFOL 10 MG/ML IV BOLUS
INTRAVENOUS | Status: AC
  Filled 2023-02-01: qty 20

## 2023-02-01 MED ORDER — EPHEDRINE SULFATE-NACL 50-0.9 MG/10ML-% IV SOSY
PREFILLED_SYRINGE | INTRAVENOUS | Status: DC | PRN
  Administered 2023-02-01: 10 mg via INTRAVENOUS

## 2023-02-01 MED ORDER — DEXAMETHASONE SODIUM PHOSPHATE 10 MG/ML IJ SOLN
INTRAMUSCULAR | Status: AC
  Filled 2023-02-01: qty 1

## 2023-02-01 MED ORDER — LACTATED RINGERS IV SOLN: Freq: Once | INTRAVENOUS | Status: AC

## 2023-02-01 MED ORDER — FENTANYL CITRATE (PF) 250 MCG/5ML IJ SOLN
INTRAMUSCULAR | Status: AC
  Filled 2023-02-01: qty 5

## 2023-02-01 MED ORDER — CHLORHEXIDINE GLUCONATE 0.12 % MT SOLN
OROMUCOSAL | Status: AC
  Administered 2023-02-01: 15 mL via OROMUCOSAL
  Filled 2023-02-01: qty 15

## 2023-02-01 MED ORDER — PROPOFOL 10 MG/ML IV BOLUS
INTRAVENOUS | Status: DC | PRN
  Administered 2023-02-01: 100 mg via INTRAVENOUS

## 2023-02-01 MED ORDER — VANCOMYCIN HCL 750 MG/150ML IV SOLN
750.0000 mg | INTRAVENOUS | Status: DC
  Filled 2023-02-01: qty 150

## 2023-02-01 MED ORDER — FENTANYL CITRATE (PF) 100 MCG/2ML IJ SOLN
INTRAMUSCULAR | Status: AC
  Filled 2023-02-01: qty 2

## 2023-02-01 MED ORDER — DROPERIDOL 2.5 MG/ML IJ SOLN: 0.6250 mg | Freq: Once | INTRAMUSCULAR | Status: DC | PRN

## 2023-02-01 MED ORDER — BUPIVACAINE-EPINEPHRINE (PF) 0.25% -1:200000 IJ SOLN
INTRAMUSCULAR | Status: AC
Start: 2023-02-01 — End: ?
  Filled 2023-02-01: qty 30

## 2023-02-01 MED ORDER — HYALURONIDASE HUMAN 150 UNIT/ML IJ SOLN
150.0000 [IU] | Freq: Once | INTRAMUSCULAR | Status: DC
  Filled 2023-02-01: qty 1

## 2023-02-01 MED ORDER — LIDOCAINE HCL (PF) 2 % IJ SOLN
INTRAMUSCULAR | Status: DC | PRN
  Administered 2023-02-01: 60 mg via INTRADERMAL

## 2023-02-01 MED ORDER — CHLORHEXIDINE GLUCONATE 0.12 % MT SOLN: 15.0000 mL | Freq: Once | OROMUCOSAL | Status: AC

## 2023-02-01 MED ORDER — VANCOMYCIN HCL 1.5 G IV SOLR
1500.0000 mg | Freq: Once | INTRAVENOUS | Status: AC
  Administered 2023-02-01: 1500 mg via INTRAVENOUS
  Filled 2023-02-01: qty 30

## 2023-02-01 MED ORDER — ROCURONIUM BROMIDE 10 MG/ML (PF) SYRINGE
PREFILLED_SYRINGE | INTRAVENOUS | Status: AC
  Filled 2023-02-01: qty 10

## 2023-02-01 SURGICAL SUPPLY — 20 items
BNDG GAUZE DERMACEA FLUFF 4 (GAUZE/BANDAGES/DRESSINGS) IMPLANT
CANISTER SUCT 3000ML PPV (MISCELLANEOUS) ×1 IMPLANT
COVER SURGICAL LIGHT HANDLE (MISCELLANEOUS) ×1 IMPLANT
DRAPE LAPAROTOMY 100X72 PEDS (DRAPES) IMPLANT
ELECT REM PT RETURN 9FT ADLT (ELECTROSURGICAL) ×1 IMPLANT
ELECTRODE REM PT RTRN 9FT ADLT (ELECTROSURGICAL) ×1 IMPLANT
GAUZE PAD ABD 8X10 STRL (GAUZE/BANDAGES/DRESSINGS) IMPLANT
GAUZE SPONGE 4X4 12PLY STRL (GAUZE/BANDAGES/DRESSINGS) IMPLANT
GLOVE BIO SURGEON STRL SZ7.5 (GLOVE) ×1 IMPLANT
GLOVE INDICATOR 8.0 STRL GRN (GLOVE) ×1 IMPLANT
GOWN STRL REUS W/ TWL LRG LVL3 (GOWN DISPOSABLE) ×1 IMPLANT
GOWN STRL REUS W/ TWL XL LVL3 (GOWN DISPOSABLE) ×1 IMPLANT
KIT BASIN OR (CUSTOM PROCEDURE TRAY) ×1 IMPLANT
KIT TURNOVER KIT B (KITS) ×1 IMPLANT
NS IRRIG 1000ML POUR BTL (IV SOLUTION) ×1 IMPLANT
PACK GENERAL/GYN (CUSTOM PROCEDURE TRAY) IMPLANT
PAD ARMBOARD 7.5X6 YLW CONV (MISCELLANEOUS) ×1 IMPLANT
SWAB COLLECTION DEVICE MRSA (MISCELLANEOUS) IMPLANT
SWAB CULTURE ESWAB REG 1ML (MISCELLANEOUS) IMPLANT
TOWEL GREEN STERILE (TOWEL DISPOSABLE) ×1 IMPLANT

## 2023-02-01 NOTE — Progress Notes (Signed)
 Pharmacy Antibiotic Note  Karla Watson is a 73 y.o. female admitted on 01/29/2023 with cellulitis.  Pharmacy has been consulted for vancomycin  dosing x 7 days.  Plan: Vancomycin  1500  mg x 1 loading dose, followed by Vancomycin  750 mg IV every 24 hours (estAUC 531.9, SCr used 1.2, Vd 0.5) Monitor clinical progress, cultures/sensitivities, renal function, abx plan Vancomycin  levels as indicated   Height: 5' 3 (160 cm) Weight: 82.1 kg (181 lb) IBW/kg (Calculated) : 52.4  Temp (24hrs), Avg:98.5 F (36.9 C), Min:97.9 F (36.6 C), Max:99.6 F (37.6 C)  Recent Labs  Lab 01/29/23 1749 01/29/23 2055 01/30/23 0059 01/30/23 0634 02/01/23 0535  WBC 9.3  --  9.0 7.7 5.7  CREATININE 1.07*  --  1.17* 1.12* 1.20*  LATICACIDVEN  --  0.9  --   --   --     Estimated Creatinine Clearance: 44.3 mL/min (A) (by C-G formula based on SCr of 1.2 mg/dL (H)).    Allergies  Allergen Reactions   Erythromycin Nausea And Vomiting    Antimicrobials this admission: 1/10 vanc x 1, 1/13 >>  1/10  ceftriaxone  >> 1/12  Dose adjustments this admission:   Microbiology results: 1/10 BCx: ng x 3d    Thank you for allowing pharmacy to be a part of this patient's care.   Bascom JAYSON Louder, PharmD 02/01/2023 2:00 PM  **Pharmacist phone directory can be found on [email or web address] listed under [facility]**

## 2023-02-01 NOTE — Anesthesia Procedure Notes (Signed)
 Procedure Name: Intubation Date/Time: 02/01/2023 4:27 PM  Performed by: Delores Dus, CRNAPre-anesthesia Checklist: Patient identified, Emergency Drugs available, Suction available and Patient being monitored Patient Re-evaluated:Patient Re-evaluated prior to induction Oxygen Delivery Method: Circle system utilized Preoxygenation: Pre-oxygenation with 100% oxygen Induction Type: IV induction Ventilation: Mask ventilation without difficulty Laryngoscope Size: Miller and 2 Grade View: Grade II Tube type: Oral Tube size: 7.0 mm Number of attempts: 1 Airway Equipment and Method: Stylet and Oral airway Placement Confirmation: ETT inserted through vocal cords under direct vision, positive ETCO2 and breath sounds checked- equal and bilateral Secured at: 22 cm Tube secured with: Tape Dental Injury: Teeth and Oropharynx as per pre-operative assessment

## 2023-02-01 NOTE — Op Note (Signed)
 01/29/2023 - 02/01/2023  4:42 PM  PATIENT:  Karla Watson  73 y.o. female  Patient Care Team: Von Pinks, MD as PCP - General (Family Medicine)  PRE-OPERATIVE DIAGNOSIS:  Abdominal wall abscess/wound  POST-OPERATIVE DIAGNOSIS:  Same  PROCEDURE:   Debridement of abdominal wall wound and drainage of associated abscess - 4 x 4 cm, epidermis, dermis, subcutaneous adipose tissue  SURGEON:  Lonni HERO. Zackary Mckeone, MD  ANESTHESIA:   general  COUNTS:  Sponge, needle and instrument counts were reported correct x2 at the conclusion of the operation.  EBL: 5 mL  DRAINS: none  SPECIMEN: Wound cultures - aerobe and anaerobe  COMPLICATIONS: none  FINDINGS: Right-sided abdominal wall abscess and wound.  Approximately 5 to 10 cc of purulent fluid is evacuated and sent for culture.  Devitalized tissue within the wound necessitating debridement, epidermis, dermis, and subcutaneous fat.  DISPOSITION: PACU in satisfactory condition  DESCRIPTION: The patient was identified in preop holding and taken to the OR where she was placed on the operating room table. SCDs were placed. General endotracheal anesthesia was induced without difficulty. She was then prepped and draped in the usual sterile fashion. A surgical timeout was performed indicating the correct patient, procedure, positioning and need for preoperative antibiotics.   The area over the wound is already necrotic and partially open is opened further using electrocautery.  We immediately encountered approximately 10 cc of purulent foul-smelling fluid consistent with pus.  This was sent for cultures-aerobic and anaerobic.  The devitalized tissue including skin, dermis, and subcutaneous fat is sharply debrided until we get back to nice healthy bleeding tissue.  All devitalized tissue was removed.  Total area of debridement is 4 x 4 x 4 cm.  The wound is then irrigated with sterile saline.  All loculations have been broken free.  The wound is  then packed with a single piece of moist Kerlix, covered with dry 4 x 4's, and an ABD pad.  This was secured with tape.  All counts were reported correct.  She is an awakened from anesthesia, extubated, and transferred to a stretcher for transport to recovery in satisfactory condition.

## 2023-02-01 NOTE — Plan of Care (Signed)

## 2023-02-01 NOTE — Anesthesia Preprocedure Evaluation (Addendum)
 Anesthesia Evaluation  Patient identified by MRN, date of birth, ID band Patient awake    Reviewed: Allergy & Precautions, NPO status , Patient's Chart, lab work & pertinent test results  Airway Mallampati: II  TM Distance: >3 FB Neck ROM: Full    Dental  (+) Dental Advisory Given, Teeth Intact, Missing   Pulmonary former smoker   Pulmonary exam normal breath sounds clear to auscultation       Cardiovascular negative cardio ROS Normal cardiovascular exam Rhythm:Regular Rate:Normal     Neuro/Psych  PSYCHIATRIC DISORDERS Anxiety Depression    Cerebral aneurysm s/p coiling 15 years ago    GI/Hepatic Neg liver ROS,GERD  Controlled and Medicated,,  Endo/Other  negative endocrine ROS    Renal/GU Renal diseaseLeft ureteral calculi  negative genitourinary   Musculoskeletal  (+) Arthritis , Osteoarthritis,    Abdominal  (+) + obese  Peds  Hematology negative hematology ROS (+)   Anesthesia Other Findings   Reproductive/Obstetrics                              Anesthesia Physical Anesthesia Plan  ASA: 3  Anesthesia Plan: General   Post-op Pain Management: Tylenol  PO (pre-op)*   Induction: Intravenous  PONV Risk Score and Plan: 3 and Ondansetron , Dexamethasone , Midazolam  and Treatment may vary due to age or medical condition  Airway Management Planned: Oral ETT  Additional Equipment: None  Intra-op Plan:   Post-operative Plan: Extubation in OR  Informed Consent: I have reviewed the patients History and Physical, chart, labs and discussed the procedure including the risks, benefits and alternatives for the proposed anesthesia with the patient or authorized representative who has indicated his/her understanding and acceptance.     Dental advisory given  Plan Discussed with: CRNA  Anesthesia Plan Comments:          Anesthesia Quick Evaluation

## 2023-02-01 NOTE — Consult Note (Signed)
 Karla Watson [birth year]  [id number].    Requesting MD: Dr. Devaughn Ban Chief Complaint/Reason for Consult: abdominal wall abscess  HPI:  This is a very pleasant 73 yo white female who was just recently diagnosed with DM with a hgbA1C of 6.1 and depression who thinks maybe she got bit by something last week.  She has developed this abdominal wall cellulitis and induration.  She saw an urgent care last week who attempted an I&D and told her if she developed a fever to go to the ED.  She then developed a temp of 100.2 on Friday and presented that day to the [facility].  She underwent a CT scan which revealed no abscess.  She was admitted and has been on Rocephin  with little improvement.  Vancomycin  was added today.  Her WBC and temp have been normal, but she has had little improvement in this cellulitis/abscess since admission.  We have been asked to see her today for further evaluation and need for intervention.  ROS: ROS: see HPI  Family History  Problem Relation Age of Onset   Breast cancer Mother    Prostate cancer Father     Past Medical History:  Diagnosis Date   Arthritis    oa hands and left foot   Depression    History of kidney stones    Insomnia    Kidney abscess 2012   Pleurisy 12/2018   tx with antibiotics   Sepsis (HCC)    in [place] from 03-07-2020 to 03-09-2020   Urinary frequency    Vertigo occ   Wears glasses     Past Surgical History:  Procedure Laterality Date   CATARACT EXTRACTION Bilateral yrs ago   CEREBRAL ANEURYSM REPAIR  15 yrs ago   no problems after coiling done   CYSTOSCOPY W/ URETERAL STENT PLACEMENT Left 03/07/2020   Procedure: CYSTOSCOPY WITH RETROGRADE PYELOGRAM/URETERAL STENT PLACEMENT;  Surgeon: Selma Donnice SAUNDERS, MD;  Location: [facility];  Service: Urology;  Laterality: Left;   CYSTOSCOPY/URETEROSCOPY/HOLMIUM LASER/STENT PLACEMENT Left 04/19/2020   Procedure: CYSTOSCOPY/RETROGRADE/URETEROSCOPY/HOLMIUM LASER/STENT PLACEMENT;  Surgeon: Selma Donnice SAUNDERS, MD;   Location: [facility];  Service: Urology;  Laterality: Left;  ONLY NEEDS 60 MIN   TUBAL LIGATION  yrs ago    Social History:  reports that she quit smoking about 21 years ago. Her smoking use included cigarettes. She started smoking about 51 years ago. She has a 30 pack-year smoking history. She has never used smokeless tobacco. She reports that she does not currently use alcohol. She reports that she does not use drugs.  Allergies:  Allergies  Allergen Reactions   Erythromycin Nausea And Vomiting    Medications Prior to Admission  Medication Sig Dispense Refill   atorvastatin  (LIPITOR) 10 MG tablet Take 10 mg by mouth daily.     BACTRIM DS 800-160 MG tablet Take 1 tablet by mouth 2 (two) times daily.     buPROPion  (WELLBUTRIN  XL) 300 MG 24 hr tablet Take 300 mg by mouth daily.     cholecalciferol (VITAMIN D3) 25 MCG (1000 UNIT) tablet Take 1,000 Units by mouth daily.     FLUoxetine  (PROZAC ) 40 MG capsule Take 40 mg by mouth daily.     Multiple Vitamin (MULTIVITAMIN) tablet Take 1 tablet by mouth daily.     omeprazole (PRILOSEC) 20 MG capsule Take 20 mg by mouth daily.     traZODone  (DESYREL ) 100 MG tablet Take 100 mg by mouth at bedtime.  Physical Exam: Blood pressure 135/65, pulse 61, temperature 98.1 F (36.7 C), temperature source Oral, resp. rate 18, height 5' 3 (1.6 m), weight 82.1 kg, last menstrual period 01/20/2007, SpO2 99%. General: pleasant, WD, WN white female who is laying in bed in NAD HEENT: head is normocephalic, atraumatic.  Sclera are noninjected.  PERRL.  Ears and nose without any masses or lesions.  Mouth is pink and moist Heart: regular, rate, and rhythm.  Normal s1,s2. No obvious murmurs, gallops, or rubs noted.  Palpable radial and pedal pulses bilaterally Lungs: CTAB, no wheezes, rhonchi, or rales noted.  Respiratory effort nonlabored Abd: soft, tender as expected around this infection, ND, +BS, no masses, hernias, or organomegaly.  She  has an areas of 6x4.5cm of induration with central necrosis.  There is some surrounding cellulitis that extends further than the indurated area.  No fluctuance noted.  MS: all 4 extremities are symmetrical with no cyanosis, clubbing, or edema.  She has an area of edema and induration at her Left AC IV site.  Her channel was turned off (vanc was running) and nurse notified to address this issue. Psych: A&Ox3 with an appropriate affect.   Results for orders placed or performed during the hospital encounter of 01/29/23 (from the past 48 hours)  CBC     Status: Abnormal   Collection Time: 02/01/23  5:35 AM  Result Value Ref Range   WBC 5.7 4.0 - 10.5 K/uL   RBC 4.10 3.87 - 5.11 MIL/uL   Hemoglobin 11.7 (L) 12.0 - 15.0 g/dL   HCT 65.0 (L) 63.9 - 53.9 %   MCV 85.1 80.0 - 100.0 fL   MCH 28.5 26.0 - 34.0 pg   MCHC 33.5 30.0 - 36.0 g/dL   RDW 87.6 88.4 - 84.4 %   Platelets 220 150 - 400 K/uL   nRBC 0.0 0.0 - 0.2 %    Comment: Performed at [facility], [address], [place], [place] [address]  Magnesium     Status: None   Collection Time: 02/01/23  5:35 AM  Result Value Ref Range   Magnesium 2.0 1.7 - 2.4 mg/dL    Comment: Performed at [facility], [address] [place], [place] [address]  Basic metabolic panel     Status: Abnormal   Collection Time: 02/01/23  5:35 AM  Result Value Ref Range   Sodium 138 135 - 145 mmol/L   Potassium 3.9 3.5 - 5.1 mmol/L   Chloride 103 98 - 111 mmol/L   CO2 27 22 - 32 mmol/L   Glucose, Bld 117 (H) 70 - 99 mg/dL    Comment: Glucose reference range applies only to samples taken after fasting for at least 8 hours.   BUN 14 8 - 23 mg/dL   Creatinine, Ser 8.79 (H) 0.44 - 1.00 mg/dL   Calcium  8.8 (L) 8.9 - 10.3 mg/dL   GFR, Estimated 49 (L) >60 mL/min    Comment: (NOTE) Calculated using the CKD-EPI Creatinine Equation (2021)    Anion gap 8 5 - 15    Comment: Performed at [facility], [address], [place], [place] [address]   Phosphorus     Status: None   Collection Time: 02/01/23  5:35 AM  Result Value Ref Range   Phosphorus 3.5 2.5 - 4.6 mg/dL    Comment: Performed at [facility], [address] [place], [place] [address]   No results found.    Assessment/Plan Abdominal wall  abscess The patient has been seen, examined, labs, vitals, chart, and imaging reviewed.  She does not have an abscess in the sense of a pocket of liquid purulence, but abscess with induration that needs to be debrided with necrotic tissue in order to improve.  Agree with adding vancomycin  to abx regimen.  She ate breakfast this am at 0800.  I have cleared with anesthesia for a 6 hr delay.  We will add her on to the schedule for I&D this afternoon.  This has been discussed with the patient and she is agreeable with this plan.  We discussed post op packing and healing by secondary intention.   FEN - NPO VTE - Lovenox  ID - Rocephin /Vancomycin   Vanc IV infiltration - made primary service aware who is going to address Depression DM - mild  I reviewed hospitalist notes, last 24 h vitals and pain scores, last 48 h intake and output, last 24 h labs and trends, and last 24 h imaging results.  Burnard FORBES Banter, [facility] Surgery 02/01/2023, 12:28 PM Please see Amion for pager number during day hours 7:00am-4:30pm or 7:00am -11:30am on weekends

## 2023-02-01 NOTE — Progress Notes (Signed)
   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, patient believes in prayer and spiritual disciplines  Community/Connection Family  Patient Stress Factors Health changes;Exhausted  Family Stress Factors Not reviewed  Interventions  Spiritual Care Interventions Made Established relationship of care and support;Compassionate presence;Reflective listening;Normalization of emotions;Explored values/beliefs/practices/strengths;Prayer;Encouragement  Intervention Outcomes  Outcomes Connection to spiritual care;Awareness of support;Awareness around self/spiritual resourses;Reduced isolation  Spiritual Care Plan  Spiritual Care Issues Still Outstanding No further spiritual care needs at this time (see row info)   Patient expressed she was lonely for God. The patient stated she is Anglican and misses her church. She has also missed her personal practice of the spiritual discipline of reading the Common Book of Prayer. Chaplain assisted patient to develop a spiritual practice she can participate in here in at the hospital. The patient also feels lost and is unsure of what will happen next with her health. Chaplain prayed with patient and provided encouragement for her continued health journey.   Alan Lesches, Chaplain Resident

## 2023-02-01 NOTE — Progress Notes (Addendum)
 PROGRESS NOTE    Karla Watson  FMW:[id number] DOB: 12-20-1950 DOA: 01/29/2023 PCP: Von Pinks, MD  Brief Narrative:  This 73 years old female with PMH significant for depression, renal cysts, nephrolithiasis presented in the ED with abdominal wall wound for last 4 days. Patient has developed a small mark in the right side of her abdomen,  She thought it is a bug bite, it continued to get worse and got bigger.  She has seen her primary care physician who has started her on Augmentin but has not seen any improvement.  Later patient has developed fever,  worsening pain.  Patient came to the ED.  CT abdomen and pelvis showed right-sided lateral cellulitis, nonobstructing renal calculi , renal cyst.  Patient started on vancomycin  and ceftriaxone  and admitted for further evaluation.   Assessment & Plan:   Principal Problem:   Cellulitis Active Problems:   Cellulitis of abdominal wall  Abdominal wall cellulitis/abscess: Patient presented with right-sided abdominal wall pain,  redness,  tenderness and swelling. There is no leukocytosis, remains afebrile. Patient was prescribed Augmentin without any improvement as an outpatient. CT no abscess but today is draining frank pus, does appear to have organized into an abscess - gen surg consult for I and D - switch abx from ceftriaxone  to vancomycin  (infiltrated later in the day, Hylenex  given) - Also has a small cellulitic tender nodule on her anterior mons that we will need to keep an eye on, for the time being think should resolve with abx  Depression: Continue bupropion  and Prozac .  Insomnia: Continue trazodone .  GERD: Continue pantoprazole .  Hyperlipidemia: Continue Lipitor  DVT prophylaxis: Lovenox  Code Status: Full code Family Communication: No family at bedside. Disposition Plan:    Status is: Inpatient Remains inpatient appropriate because: ongoing need for IV abx   Consultants:  Gen surg  Procedures:  pending  Antimicrobials:  Anti-infectives (From admission, onward)    Start     Dose/Rate Route Frequency Ordered Stop   01/30/23 1000  cefTRIAXone  (ROCEPHIN ) 2 g in sodium chloride  0.9 % 100 mL IVPB  Status:  Discontinued        2 g 200 mL/hr over 30 Minutes Intravenous Daily 01/29/23 2359 02/01/23 0900   01/29/23 2045  vancomycin  (VANCOREADY) IVPB 1500 mg/300 mL        1,500 mg 150 mL/hr over 120 Minutes Intravenous  Once 01/29/23 2030 01/30/23 0134   01/29/23 2030  Vancomycin  (VANCOCIN ) 1,500 mg in sodium chloride  0.9 % 500 mL IVPB  Status:  Discontinued        1,500 mg 250 mL/hr over 120 Minutes Intravenous  Once 01/29/23 2024 01/29/23 2028   01/29/23 2030  vancomycin  (VANCOCIN ) 1,500 mg in sodium chloride  0.9 % 500 mL IVPB  Status:  Discontinued        1,500 mg 257.5 mL/hr over 120 Minutes Intravenous  Once 01/29/23 2028 01/29/23 2028   01/29/23 2015  vancomycin  (VANCOCIN ) IVPB 1000 mg/200 mL premix  Status:  Discontinued        1,000 mg 200 mL/hr over 60 Minutes Intravenous  Once 01/29/23 2008 01/29/23 2024   01/29/23 2015  cefTRIAXone  (ROCEPHIN ) 1 g in sodium chloride  0.9 % 100 mL IVPB        1 g 200 mL/hr over 30 Minutes Intravenous  Once 01/29/23 2008 01/29/23 2144       Subjective: Ongoing pain and drainage from abdominal wound  Objective: Vitals:   01/31/23 1744 01/31/23 2023 02/01/23 0541 02/01/23 0836  BP: ROLLEN)  135/52 (!) 124/44 135/62 135/65  Pulse: 62 (!) 57 (!) 57 61  Resp: 18 18 18 18   Temp:  99.6 F (37.6 C) 97.9 F (36.6 C) 98.1 F (36.7 C)  TempSrc:  Oral Oral Oral  SpO2: 94% 95% 92% 99%  Weight:      Height:       No intake or output data in the 24 hours ending 02/01/23 0901  Filed Weights   01/29/23 1739  Weight: 82.1 kg    Examination:  General exam: Appears calm and comfortable, not in any acute distress Respiratory system: Clear to auscultation. Respiratory effort normal.  RR 15 Cardiovascular system: S1 & S2 heard, RRR. No JVD, murmurs,  rubs, gallops or clicks.  Gastrointestinal system: Abdomen is non distended, soft and non tender.  Right lower abdomen erythematous, warm, tender,  normal bowel sounds heard. Central nervous system: Alert and oriented x 3. No focal neurological deficits. Extremities: No edema, no cyanosis, no clubbing Skin: draining abscess right abdomen with surrounding cellulitis Psychiatry: Judgement and insight appear normal. Mood & affect appropriate.     Data Reviewed: I have personally reviewed following labs and imaging studies  CBC: Recent Labs  Lab 01/29/23 1749 01/30/23 0059 01/30/23 0634 02/01/23 0535  WBC 9.3 9.0 7.7 5.7  HGB 13.3 12.0 11.8* 11.7*  HCT 39.6 35.8* 35.2* 34.9*  MCV 85.7 85.4 85.6 85.1  PLT 229 198 204 220   Basic Metabolic Panel: Recent Labs  Lab 01/29/23 1749 01/30/23 0059 01/30/23 0634 02/01/23 0535  NA 137  --  139 138  K 4.0  --  4.0 3.9  CL 101  --  103 103  CO2 25  --  26 27  GLUCOSE 138*  --  131* 117*  BUN 12  --  9 14  CREATININE 1.07* 1.17* 1.12* 1.20*  CALCIUM  9.4  --  8.6* 8.8*  MG  --   --   --  2.0  PHOS  --   --   --  3.5   GFR: Estimated Creatinine Clearance: 44.3 mL/min (A) (by C-G formula based on SCr of 1.2 mg/dL (H)). Liver Function Tests: Recent Labs  Lab 01/29/23 1749 01/30/23 0634  AST 16 12*  ALT 15 12  ALKPHOS 57 52  BILITOT 0.6 0.9  PROT 6.5 5.6*  ALBUMIN 3.3* 2.9*   Recent Labs  Lab 01/29/23 1749  LIPASE 39   No results for input(s): AMMONIA in the last 168 hours. Coagulation Profile: No results for input(s): INR, PROTIME in the last 168 hours. Cardiac Enzymes: No results for input(s): CKTOTAL, CKMB, CKMBINDEX, TROPONINI in the last 168 hours. BNP (last 3 results) No results for input(s): PROBNP in the last 8760 hours. HbA1C: No results for input(s): HGBA1C in the last 72 hours. CBG: No results for input(s): GLUCAP in the last 168 hours. Lipid Profile: No results for input(s): CHOL,  HDL, LDLCALC, TRIG, CHOLHDL, LDLDIRECT in the last 72 hours. Thyroid Function Tests: No results for input(s): TSH, T4TOTAL, FREET4, T3FREE, THYROIDAB in the last 72 hours. Anemia Panel: No results for input(s): VITAMINB12, FOLATE, FERRITIN, TIBC, IRON, RETICCTPCT in the last 72 hours. Sepsis Labs: Recent Labs  Lab 01/29/23 2055  LATICACIDVEN 0.9    Recent Results (from the past 240 hours)  Culture, blood (routine x 2)     Status: None (Preliminary result)   Collection Time: 01/29/23  8:07 PM   Specimen: BLOOD  Result Value Ref Range Status   Specimen Description BLOOD LEFT ANTECUBITAL  Final   Special Requests   Final    BOTTLES DRAWN AEROBIC AND ANAEROBIC Blood Culture results may not be optimal due to an inadequate volume of blood received in culture bottles   Culture   Final    NO GROWTH 3 DAYS Performed at [facility], [address], [place], [place] [address]    Report Status PENDING  Incomplete  Culture, blood (routine x 2)     Status: None (Preliminary result)   Collection Time: 01/29/23  8:12 PM   Specimen: BLOOD  Result Value Ref Range Status   Specimen Description BLOOD BLOOD LEFT HAND  Final   Special Requests   Final    BOTTLES DRAWN AEROBIC AND ANAEROBIC Blood Culture adequate volume   Culture   Final    NO GROWTH 3 DAYS Performed at [facility], [address] [place], [place] [address]    Report Status PENDING  Incomplete    Radiology Studies: No results found.  Scheduled Meds:  atorvastatin   10 mg Oral Daily   buPROPion   300 mg Oral Daily   enoxaparin  (LOVENOX ) injection  40 mg Subcutaneous Daily   FLUoxetine   40 mg Oral Daily   pantoprazole   40 mg Oral Daily   traZODone   100 mg Oral QHS   Continuous Infusions:     LOS: 2 days       Devaughn KATHEE Ban, MD [facility]   If 7PM-7AM, please contact night-coverage

## 2023-02-01 NOTE — Transfer of Care (Signed)
 Immediate Anesthesia Transfer of Care Note  Patient: Karla Watson  Procedure(s) Performed: IRRIGATION AND DEBRIDEMENT ABSCESS ABDOMINAL WALL  Patient Location: PACU  Anesthesia Type:General  Level of Consciousness: awake and patient cooperative  Airway & Oxygen Therapy: Patient Spontanous Breathing and Patient connected to face mask oxygen  Post-op Assessment: Report given to RN and Post -op Vital signs reviewed and stable  Post vital signs: Reviewed and stable  Last Vitals:  Vitals Value Taken Time  BP 101/76 02/01/23 1700  Temp    Pulse 84 02/01/23 1702  Resp 17 02/01/23 1702  SpO2 95 % 02/01/23 1702  Vitals shown include unfiled device data.  Last Pain:  Vitals:   02/01/23 1553  TempSrc: Oral  PainSc: 7       Patients Stated Pain Goal: 2 (02/01/23 1553)  Complications: No notable events documented.

## 2023-02-02 ENCOUNTER — Encounter (HOSPITAL_COMMUNITY): Payer: Self-pay | Admitting: Surgery

## 2023-02-02 DIAGNOSIS — L02211 Cutaneous abscess of abdominal wall: Secondary | ICD-10-CM | POA: Diagnosis not present

## 2023-02-02 DIAGNOSIS — E119 Type 2 diabetes mellitus without complications: Secondary | ICD-10-CM | POA: Diagnosis not present

## 2023-02-02 DIAGNOSIS — L03311 Cellulitis of abdominal wall: Secondary | ICD-10-CM | POA: Diagnosis not present

## 2023-02-02 MED ORDER — TRAMADOL HCL 50 MG PO TABS
50.0000 mg | ORAL_TABLET | Freq: Four times a day (QID) | ORAL | Status: DC | PRN
  Administered 2023-02-03: 50 mg via ORAL
  Filled 2023-02-02: qty 1

## 2023-02-02 MED ORDER — VANCOMYCIN HCL 750 MG IV SOLR
750.0000 mg | INTRAVENOUS | Status: DC
  Administered 2023-02-02 – 2023-02-04 (×3): 750 mg via INTRAVENOUS
  Filled 2023-02-02 (×3): qty 15

## 2023-02-02 MED ORDER — TRAMADOL HCL 50 MG PO TABS: 50.0000 mg | ORAL_TABLET | Freq: Four times a day (QID) | ORAL | 0 refills | Status: DC | PRN

## 2023-02-02 NOTE — Progress Notes (Addendum)
 1 Day Post-Op  Subjective: Feels much better today.  Less painful.  ROS: See above, otherwise other systems negative  Objective: Vital signs in last 24 hours: Temp:  [97.4 F (36.3 C)-98.8 F (37.1 C)] 97.8 F (36.6 C) (01/14 0552) Pulse Rate:  [57-85] 57 (01/14 0552) Resp:  [14-19] 18 (01/14 0552) BP: (101-150)/(46-76) 105/46 (01/14 0552) SpO2:  [92 %-99 %] 97 % (01/14 0552) Last BM Date : 02/01/23  Intake/Output from previous day: 01/13 0701 - 01/14 0700 In: 400 [I.V.:400] Out: 5 [Blood:5] Intake/Output this shift: No intake/output data recorded.  PE: Abd: cellulitis is much less intense and more withdrawn today than yesterday.  Packing removed and dressing changed.  Wound base is clean with beefy red tissue.  No purulent drainage or necrotic tissue present  Lab Results:  Recent Labs    02/01/23 0535  WBC 5.7  HGB 11.7*  HCT 34.9*  PLT 220   BMET Recent Labs    02/01/23 0535  NA 138  K 3.9  CL 103  CO2 27  GLUCOSE 117*  BUN 14  CREATININE 1.20*  CALCIUM  8.8*   PT/INR No results for input(s): LABPROT, INR in the last 72 hours. CMP     Component Value Date/Time   NA 138 02/01/2023 0535   K 3.9 02/01/2023 0535   CL 103 02/01/2023 0535   CO2 27 02/01/2023 0535   GLUCOSE 117 (H) 02/01/2023 0535   BUN 14 02/01/2023 0535   CREATININE 1.20 (H) 02/01/2023 0535   CALCIUM  8.8 (L) 02/01/2023 0535   PROT 5.6 (L) 01/30/2023 0634   ALBUMIN 2.9 (L) 01/30/2023 0634   AST 12 (L) 01/30/2023 0634   ALT 12 01/30/2023 0634   ALKPHOS 52 01/30/2023 0634   BILITOT 0.9 01/30/2023 0634   GFRNONAA 49 (L) 02/01/2023 0535   GFRAA >60 12/22/2018 1453   Lipase     Component Value Date/Time   LIPASE 39 01/29/2023 1749       Studies/Results: No results found.  Anti-infectives: Anti-infectives (From admission, onward)    Start     Dose/Rate Route Frequency Ordered Stop   02/02/23 1200  vancomycin  (VANCOREADY) IVPB 750 mg/150 mL        750 mg 150  mL/hr over 60 Minutes Intravenous Every 24 hours 02/01/23 1344     02/01/23 1015  Vancomycin  (VANCOCIN ) 1,500 mg in sodium chloride  0.9 % 500 mL IVPB        1,500 mg 250 mL/hr over 120 Minutes Intravenous  Once 02/01/23 0921 02/01/23 1231   01/30/23 1000  cefTRIAXone  (ROCEPHIN ) 2 g in sodium chloride  0.9 % 100 mL IVPB  Status:  Discontinued        2 g 200 mL/hr over 30 Minutes Intravenous Daily 01/29/23 2359 02/01/23 0900   01/29/23 2045  vancomycin  (VANCOREADY) IVPB 1500 mg/300 mL        1,500 mg 150 mL/hr over 120 Minutes Intravenous  Once 01/29/23 2030 01/30/23 0134   01/29/23 2030  Vancomycin  (VANCOCIN ) 1,500 mg in sodium chloride  0.9 % 500 mL IVPB  Status:  Discontinued        1,500 mg 250 mL/hr over 120 Minutes Intravenous  Once 01/29/23 2024 01/29/23 2028   01/29/23 2030  vancomycin  (VANCOCIN ) 1,500 mg in sodium chloride  0.9 % 500 mL IVPB  Status:  Discontinued        1,500 mg 257.5 mL/hr over 120 Minutes Intravenous  Once 01/29/23 2028 01/29/23 2028   01/29/23 2015  vancomycin  (VANCOCIN )  IVPB 1000 mg/200 mL premix  Status:  Discontinued        1,000 mg 200 mL/hr over 60 Minutes Intravenous  Once 01/29/23 2008 01/29/23 2024   01/29/23 2015  cefTRIAXone  (ROCEPHIN ) 1 g in sodium chloride  0.9 % 100 mL IVPB        1 g 200 mL/hr over 30 Minutes Intravenous  Once 01/29/23 2008 01/29/23 2144        Assessment/Plan POD 1, s/p I&D of abdominal wall abscess, Dr. Teresa 1/13 -cx pending, but growing gram + cocci currently -abx per primary -BID dressing changes while here but transition to daily at home -I have explained wound care to the patient and she states her husband will be able to help her with this at home. -surgically stable for DC home when medically felt stable.  Message sent to primary service to discuss recommendations -follow up in our office in 3 weeks being arranged.   FEN - carb mod VTE - Lovenox  ID - vanc  DM depression  I reviewed hospitalist notes, last 24 h  vitals and pain scores, last 48 h intake and output, last 24 h labs and trends, and last 24 h imaging results.   LOS: 3 days    Burnard FORBES Banter , [facility] Surgery 02/02/2023, 8:05 AM Please see Amion for pager number during day hours 7:00am-4:30pm or 7:00am -11:30am on weekends

## 2023-02-02 NOTE — Plan of Care (Signed)

## 2023-02-02 NOTE — Discharge Instructions (Signed)
WOUND CARE: - midline dressing to be changed daily - supplies: sterile saline, gauze, scissors, tape  - remove dressing and all packing carefully, moistening with sterile saline as needed to avoid packing/internal dressing sticking to the wound. - clean edges of skin around the wound with water/gauze, making sure there is no tape debris or leakage left on skin that could cause skin irritation or breakdown. - dampen and clean gauze with sterile saline and pack wound from wound base to skin level, making sure to take note of any possible areas of wound tracking, tunneling and packing appropriately. Wound can be packed loosely. Trim gauze to size if a whole gauze is not required. - cover wound with a dry gauze and secure with tape.  - write the date/time on the dry dressing/tape to better track when the last dressing change occurred. - change dressing as needed if leakage occurs, wound gets contaminated, or patient requests to shower. - patient may shower daily with wound open (i.e. remove all packing) and following the shower the wound should be dried and a clean dressing placed.

## 2023-02-02 NOTE — Anesthesia Postprocedure Evaluation (Signed)
 Anesthesia Post Note  Patient: Karla Watson  Procedure(s) Performed: IRRIGATION AND DEBRIDEMENT ABSCESS ABDOMINAL WALL (Abdomen)     Patient location during evaluation: PACU Anesthesia Type: General Level of consciousness: sedated and patient cooperative Pain management: pain level controlled Vital Signs Assessment: post-procedure vital signs reviewed and stable Respiratory status: spontaneous breathing Cardiovascular status: stable Anesthetic complications: no   No notable events documented.  Last Vitals:  Vitals:   02/02/23 0552 02/02/23 0826  BP: (!) 105/46 (!) 120/52  Pulse: (!) 57 (!) 56  Resp: 18   Temp: 36.6 C 36.7 C  SpO2: 97% 99%    Last Pain:  Vitals:   02/02/23 0826  TempSrc: Oral  PainSc:                  [name]

## 2023-02-02 NOTE — Progress Notes (Signed)
 PROGRESS NOTE    Karla Watson  FMW:[id number] DOB: 1950/10/02 DOA: 01/29/2023 PCP: Von Pinks, MD  Brief Narrative:  This 73 years old female with PMH significant for depression, renal cysts, nephrolithiasis presented in the ED with abdominal wall wound for last 4 days. Patient has developed a small mark in the right side of her abdomen,  She thought it is a bug bite, it continued to get worse and got bigger.  She has seen her primary care physician who has started her on Augmentin but has not seen any improvement.  Later patient has developed fever,  worsening pain.  Patient came to the ED.  CT abdomen and pelvis showed right-sided lateral cellulitis, nonobstructing renal calculi , renal cyst.  Patient started on vancomycin  and ceftriaxone  and admitted for further evaluation.   Assessment & Plan:   Principal Problem:   Cellulitis and abscess of trunk Active Problems:   Depression with anxiety   Class 1 obesity due to excess calories with body mass index (BMI) of 32.0 to 32.9 in adult   Moderate episode of recurrent major depressive disorder (HCC)  Abdominal wall cellulitis/abscess: Patient presented with right-sided abdominal wall pain,  redness,  tenderness and swelling. There is no leukocytosis, remains afebrile. Patient was prescribed Augmentin without any improvement as an outpatient. CT no abscess but today is draining frank pus, does appear to have organized into an abscess - gen surg consult for I and D - switch abx from ceftriaxone  to vancomycin  (infiltrated later in the day, Hylenex  given) - Also has a small cellulitic tender nodule on her anterior mons that we will need to keep an eye on, for the time being think should resolve with abx --Culture of wound has grown out staph. Sensitivities are pending.   Depression: Continue bupropion  and Prozac .  Insomnia: Continue trazodone .  GERD: Continue pantoprazole .  Hyperlipidemia: Continue Lipitor  DVT prophylaxis:  Lovenox  Code Status: Full code Family Communication: No family at bedside. Disposition Plan:    Status is: Inpatient Remains inpatient appropriate because: ongoing need for IV abx   Consultants:  Gen surg  Procedures: I&D of abdominal wall abscess.  Antimicrobials:  Anti-infectives (From admission, onward)    Start     Dose/Rate Route Frequency Ordered Stop   02/02/23 1300  vancomycin  (VANCOCIN ) 750 mg in sodium chloride  0.9 % 250 mL IVPB        750 mg 250 mL/hr over 60 Minutes Intravenous Every 24 hours 02/02/23 1245     02/02/23 1200  vancomycin  (VANCOREADY) IVPB 750 mg/150 mL  Status:  Discontinued        750 mg 150 mL/hr over 60 Minutes Intravenous Every 24 hours 02/01/23 1344 02/02/23 1250   02/01/23 1015  Vancomycin  (VANCOCIN ) 1,500 mg in sodium chloride  0.9 % 500 mL IVPB        1,500 mg 250 mL/hr over 120 Minutes Intravenous  Once 02/01/23 0921 02/01/23 1231   01/30/23 1000  cefTRIAXone  (ROCEPHIN ) 2 g in sodium chloride  0.9 % 100 mL IVPB  Status:  Discontinued        2 g 200 mL/hr over 30 Minutes Intravenous Daily 01/29/23 2359 02/01/23 0900   01/29/23 2045  vancomycin  (VANCOREADY) IVPB 1500 mg/300 mL        1,500 mg 150 mL/hr over 120 Minutes Intravenous  Once 01/29/23 2030 01/30/23 0134   01/29/23 2030  Vancomycin  (VANCOCIN ) 1,500 mg in sodium chloride  0.9 % 500 mL IVPB  Status:  Discontinued  1,500 mg 250 mL/hr over 120 Minutes Intravenous  Once 01/29/23 2024 01/29/23 2028   01/29/23 2030  vancomycin  (VANCOCIN ) 1,500 mg in sodium chloride  0.9 % 500 mL IVPB  Status:  Discontinued        1,500 mg 257.5 mL/hr over 120 Minutes Intravenous  Once 01/29/23 2028 01/29/23 2028   01/29/23 2015  vancomycin  (VANCOCIN ) IVPB 1000 mg/200 mL premix  Status:  Discontinued        1,000 mg 200 mL/hr over 60 Minutes Intravenous  Once 01/29/23 2008 01/29/23 2024   01/29/23 2015  cefTRIAXone  (ROCEPHIN ) 1 g in sodium chloride  0.9 % 100 mL IVPB        1 g 200 mL/hr over 30 Minutes  Intravenous  Once 01/29/23 2008 01/29/23 2144       Subjective: Pain is improving.   Objective: Vitals:   02/01/23 2127 02/02/23 0552 02/02/23 0826 02/02/23 1556  BP: (!) 124/53 (!) 105/46 (!) 120/52 (!) 133/57  Pulse: 61 (!) 57 (!) 56 (!) 57  Resp: 17 18    Temp: 98.8 F (37.1 C) 97.8 F (36.6 C) 98.1 F (36.7 C) 98.4 F (36.9 C)  TempSrc: Oral Oral Oral Oral  SpO2: 95% 97% 99% 98%  Weight:      Height:        Intake/Output Summary (Last 24 hours) at 02/02/2023 1821 Last data filed at 02/02/2023 1600 Gross per 24 hour  Intake 250.41 ml  Output --  Net 250.41 ml    Filed Weights   01/29/23 1739  Weight: 82.1 kg    Examination:  Exam:  Constitutional:  The patient is awake, alert, and oriented x 3. No acute distress. Respiratory:  No increased work of breathing. No wheezes, rales, or rhonchi No tactile fremitus Cardiovascular:  Regular rate and rhythm No murmurs, ectopy, or gallups. No lateral PMI. No thrills. Abdomen:  Abdomen is soft, non-tender, non-distended No hernias, masses, or organomegaly Normoactive bowel sounds.  Musculoskeletal:  No cyanosis, clubbing, or edema Skin:  No rashes, lesions, ulcers palpation of skin: no induration or nodules No erythema surrounding wound. Bandage is clean and dry. Neurologic:  CN 2-12 intact Sensation all 4 extremities intact Psychiatric:  Mental status Mood, affect appropriate Orientation to person, place, time  judgment and insight appear intact  Data Reviewed: I have personally reviewed following labs and imaging studies    CBC: CBC    Component Value Date/Time   WBC 4.7 02/03/2023 0617   RBC 4.23 02/03/2023 0617   HGB 12.1 02/03/2023 0617   HCT 35.9 (L) 02/03/2023 0617   PLT 248 02/03/2023 0617   MCV 84.9 02/03/2023 0617   MCH 28.6 02/03/2023 0617   MCHC 33.7 02/03/2023 0617   RDW 12.3 02/03/2023 0617   LYMPHSABS 1.2 02/03/2023 0617   MONOABS 0.6 02/03/2023 0617   EOSABS 0.3 02/03/2023  0617   BASOSABS 0.0 02/03/2023 0617    Basic Metabolic Panel:    Latest Ref Rng & Units 02/03/2023    6:17 AM 02/01/2023    5:35 AM 01/30/2023    6:34 AM  BMP  Glucose 70 - 99 mg/dL 880  882  868   BUN 8 - 23 mg/dL 8  14  9    Creatinine 0.44 - 1.00 mg/dL 9.01  8.79  8.87   Sodium 135 - 145 mmol/L 140  138  139   Potassium 3.5 - 5.1 mmol/L 3.8  3.9  4.0   Chloride 98 - 111 mmol/L 106  103  103   CO2 22 - 32 mmol/L 27  27  26    Calcium  8.9 - 10.3 mg/dL 8.6  8.8  8.6     Recent Results (from the past 240 hours)  Culture, blood (routine x 2)     Status: None (Preliminary result)   Collection Time: 01/29/23  8:07 PM   Specimen: BLOOD  Result Value Ref Range Status   Specimen Description BLOOD LEFT ANTECUBITAL  Final   Special Requests   Final    BOTTLES DRAWN AEROBIC AND ANAEROBIC Blood Culture results may not be optimal due to an inadequate volume of blood received in culture bottles   Culture   Final    NO GROWTH 4 DAYS Performed at [facility], [address], [place], [place] [address]    Report Status PENDING  Incomplete  Culture, blood (routine x 2)     Status: None (Preliminary result)   Collection Time: 01/29/23  8:12 PM   Specimen: BLOOD  Result Value Ref Range Status   Specimen Description BLOOD BLOOD LEFT HAND  Final   Special Requests   Final    BOTTLES DRAWN AEROBIC AND ANAEROBIC Blood Culture adequate volume   Culture   Final    NO GROWTH 4 DAYS Performed at [facility], [address] [place], [place] [address]    Report Status PENDING  Incomplete  Aerobic/Anaerobic Culture w Gram Stain (surgical/deep wound)     Status: None (Preliminary result)   Collection Time: 02/01/23  4:35 PM   Specimen: Path fluid; Body Fluid  Result Value Ref Range Status   Specimen Description ABSCESS ABDOMEN  Final   Special Requests PT ON VANC ROCEPHIN   Final   Gram Stain   Final    FEW WBC PRESENT, PREDOMINANTLY PMN FEW GRAM POSITIVE COCCI IN PAIRS IN CLUSTERS     Culture   Final    FEW STAPHYLOCOCCUS AUREUS SUSCEPTIBILITIES TO FOLLOW Performed at [facility], [address], [place], [place] [address]    Report Status PENDING  Incomplete    Radiology Studies: No results found.  Scheduled Meds:  atorvastatin   10 mg Oral Daily   buPROPion   300 mg Oral Daily   enoxaparin  (LOVENOX ) injection  40 mg Subcutaneous Daily   FLUoxetine   40 mg Oral Daily   hyaluronidase  Human  150 Units Subcutaneous Once   pantoprazole   40 mg Oral Daily   traZODone   100 mg Oral QHS   Continuous Infusions:  vancomycin  750 mg (02/02/23 1358)      LOS: 3 days       Brigida Bureau, MD [facility] 02/02/2023 7:45 PM   If 7PM-7AM, please contact night-coverage

## 2023-02-03 DIAGNOSIS — L03319 Cellulitis of trunk, unspecified: Secondary | ICD-10-CM

## 2023-02-03 DIAGNOSIS — L02219 Cutaneous abscess of trunk, unspecified: Secondary | ICD-10-CM

## 2023-02-03 LAB — CBC WITH DIFFERENTIAL/PLATELET
Abs Immature Granulocytes: 0.03 10*3/uL (ref 0.00–0.07)
Basophils Absolute: 0 10*3/uL (ref 0.0–0.1)
Basophils Relative: 0 %
Eosinophils Absolute: 0.3 10*3/uL (ref 0.0–0.5)
Eosinophils Relative: 5 %
HCT: 35.9 % — ABNORMAL LOW (ref 36.0–46.0)
Hemoglobin: 12.1 g/dL (ref 12.0–15.0)
Immature Granulocytes: 1 %
Lymphocytes Relative: 25 %
Lymphs Abs: 1.2 10*3/uL (ref 0.7–4.0)
MCH: 28.6 pg (ref 26.0–34.0)
MCHC: 33.7 g/dL (ref 30.0–36.0)
MCV: 84.9 fL (ref 80.0–100.0)
Monocytes Absolute: 0.6 10*3/uL (ref 0.1–1.0)
Monocytes Relative: 13 %
Neutro Abs: 2.6 10*3/uL (ref 1.7–7.7)
Neutrophils Relative %: 56 %
Platelets: 248 10*3/uL (ref 150–400)
RBC: 4.23 MIL/uL (ref 3.87–5.11)
RDW: 12.3 % (ref 11.5–15.5)
WBC: 4.7 10*3/uL (ref 4.0–10.5)
nRBC: 0 % (ref 0.0–0.2)

## 2023-02-03 LAB — CULTURE, BLOOD (ROUTINE X 2)
Culture: NO GROWTH
Culture: NO GROWTH
Special Requests: ADEQUATE

## 2023-02-03 LAB — BASIC METABOLIC PANEL
Anion gap: 7 (ref 5–15)
BUN: 8 mg/dL (ref 8–23)
CO2: 27 mmol/L (ref 22–32)
Calcium: 8.6 mg/dL — ABNORMAL LOW (ref 8.9–10.3)
Chloride: 106 mmol/L (ref 98–111)
Creatinine, Ser: 0.98 mg/dL (ref 0.44–1.00)
GFR, Estimated: >60 mL/min (ref >60)
Glucose, Bld: 119 mg/dL — ABNORMAL HIGH (ref 70–99)
Potassium: 3.8 mmol/L (ref 3.5–5.1)
Sodium: 140 mmol/L (ref 135–145)

## 2023-02-03 NOTE — Care Management Important Message (Signed)
 Important Message  Patient Details  Name: Karla Watson MRN: [id number] Date of Birth: 05-Nov-1950   Important Message Given:  Yes - Medicare IM     Wynonia Hedges 02/03/2023, 11:09 AM

## 2023-02-03 NOTE — Progress Notes (Signed)
 Mobility Specialist: Progress Note   02/03/23 1539  Mobility  Activity Ambulated independently in hallway  Level of Assistance Independent  Assistive Device Other (Comment) (HHA)  Distance Ambulated (ft) 350 ft  Activity Response Tolerated well  Mobility Referral Yes  Mobility visit 1 Mobility  Mobility Specialist Start Time (ACUTE ONLY) 1451  Mobility Specialist Stop Time (ACUTE ONLY) 1457  Mobility Specialist Time Calculation (min) (ACUTE ONLY) 6 min    Pt was agreeable to mobility session - received in bed. Ind with no complaints. Only using HHA bc pt stated she gets a little lightheaded while ambulating longer distances. Otherwise pt does not need AD. Returned to room without fault. Left in bed with all needs met, call bell in reach.   Deloria Fetch Mobility Specialist Please contact via SecureChat or Rehab office at [phone]

## 2023-02-03 NOTE — Progress Notes (Signed)
 PROGRESS NOTE    Karla Watson  JYN:[id number] DOB: April 08, 1950 DOA: 01/29/2023 PCP: Alvia Awkward, MD  Brief Narrative:  This 73 years old female with PMH significant for depression, renal cysts, nephrolithiasis presented in the ED with abdominal wall wound for last 4 days. Patient has developed a small mark in the right side of her abdomen,  She thought it is a bug bite, it continued to get worse and got bigger.  She has seen her primary care physician who has started her on Augmentin but has not seen any improvement.  Later patient has developed fever,  worsening pain.  Patient came to the ED.  CT abdomen and pelvis showed right-sided lateral cellulitis, nonobstructing renal calculi , renal cyst.  Patient started on vancomycin  and ceftriaxone  and admitted for further evaluation.   Wound culture has grown out staph. Awaiting sensitivities.  Assessment & Plan:   Principal Problem:   Cellulitis and abscess of trunk Active Problems:   Depression with anxiety   Class 1 obesity due to excess calories with body mass index (BMI) of 32.0 to 32.9 in adult   Moderate episode of recurrent major depressive disorder (HCC)  Abdominal wall cellulitis/abscess: Patient presented with right-sided abdominal wall pain,  redness,  tenderness and swelling. There is no leukocytosis, remains afebrile. Patient was prescribed Augmentin without any improvement as an outpatient. CT no abscess but today is draining frank pus, does appear to have organized into an abscess - gen surg consult for I and D - switch abx from ceftriaxone  to vancomycin  (infiltrated later in the day, Hylenex  given) - Also has a small cellulitic tender nodule on her anterior mons that we will need to keep an eye on, for the time being think should resolve with abx --Culture of wound has grown out staph. Sensitivities are pending.   Depression: Continue bupropion  and Prozac .  Insomnia: Continue trazodone .  GERD: Continue  pantoprazole .  Hyperlipidemia: Continue Lipitor  DVT prophylaxis: Lovenox  Code Status: Full code Family Communication: No family at bedside. Disposition Plan:    Status is: Inpatient Remains inpatient appropriate because: ongoing need for IV abx   Consultants:  Gen surg  Procedures: I&D of abdominal wall abscess.  Antimicrobials:  Anti-infectives (From admission, onward)    Start     Dose/Rate Route Frequency Ordered Stop   02/02/23 1300  vancomycin  (VANCOCIN ) 750 mg in sodium chloride  0.9 % 250 mL IVPB        750 mg 250 mL/hr over 60 Minutes Intravenous Every 24 hours 02/02/23 1245     02/02/23 1200  vancomycin  (VANCOREADY) IVPB 750 mg/150 mL  Status:  Discontinued        750 mg 150 mL/hr over 60 Minutes Intravenous Every 24 hours 02/01/23 1344 02/02/23 1250   02/01/23 1015  Vancomycin  (VANCOCIN ) 1,500 mg in sodium chloride  0.9 % 500 mL IVPB        1,500 mg 250 mL/hr over 120 Minutes Intravenous  Once 02/01/23 0921 02/01/23 1231   01/30/23 1000  cefTRIAXone  (ROCEPHIN ) 2 g in sodium chloride  0.9 % 100 mL IVPB  Status:  Discontinued        2 g 200 mL/hr over 30 Minutes Intravenous Daily 01/29/23 2359 02/01/23 0900   01/29/23 2045  vancomycin  (VANCOREADY) IVPB 1500 mg/300 mL        1,500 mg 150 mL/hr over 120 Minutes Intravenous  Once 01/29/23 2030 01/30/23 0134   01/29/23 2030  Vancomycin  (VANCOCIN ) 1,500 mg in sodium chloride  0.9 % 500 mL IVPB  Status:  Discontinued        1,500 mg 250 mL/hr over 120 Minutes Intravenous  Once 01/29/23 2024 01/29/23 2028   01/29/23 2030  vancomycin  (VANCOCIN ) 1,500 mg in sodium chloride  0.9 % 500 mL IVPB  Status:  Discontinued        1,500 mg 257.5 mL/hr over 120 Minutes Intravenous  Once 01/29/23 2028 01/29/23 2028   01/29/23 2015  vancomycin  (VANCOCIN ) IVPB 1000 mg/200 mL premix  Status:  Discontinued        1,000 mg 200 mL/hr over 60 Minutes Intravenous  Once 01/29/23 2008 01/29/23 2024   01/29/23 2015  cefTRIAXone  (ROCEPHIN ) 1 g in  sodium chloride  0.9 % 100 mL IVPB        1 g 200 mL/hr over 30 Minutes Intravenous  Once 01/29/23 2008 01/29/23 2144       Subjective: Pain is improving.   Objective: Vitals:   02/03/23 0524 02/03/23 0828 02/03/23 1613 02/03/23 2023  BP: (!) 148/58 (!) 160/67 (!) 144/60 (!) 153/56  Pulse: (!) 55 (!) 55 69 60  Resp: 18     Temp: (!) 97.3 F (36.3 C) 98.2 F (36.8 C) 98.9 F (37.2 C) 98.2 F (36.8 C)  TempSrc: Oral Oral Oral Oral  SpO2: 98% 99% 98% 96%  Weight:      Height:        Intake/Output Summary (Last 24 hours) at 02/03/2023 2149 Last data filed at 02/03/2023 2113 Gross per 24 hour  Intake 249.59 ml  Output --  Net 249.59 ml    Filed Weights   01/29/23 1739  Weight: 82.1 kg    Examination:  Exam:  Constitutional:  The patient is awake, alert, and oriented x 3. No acute distress. Respiratory:  No increased work of breathing. No wheezes, rales, or rhonchi No tactile fremitus Cardiovascular:  Regular rate and rhythm No murmurs, ectopy, or gallups. No lateral PMI. No thrills. Abdomen:  Abdomen is soft, non-tender, non-distended No hernias, masses, or organomegaly Normoactive bowel sounds.  Musculoskeletal:  No cyanosis, clubbing, or edema Skin:  No rashes, lesions, ulcers palpation of skin: no induration or nodules No erythema surrounding wound. Bandage is clean and dry. Neurologic:  CN 2-12 intact Sensation all 4 extremities intact Psychiatric:  Mental status Mood, affect appropriate Orientation to person, place, time  judgment and insight appear intact  Data Reviewed: I have personally reviewed following labs and imaging studies    CBC: CBC    Component Value Date/Time   WBC 4.7 02/03/2023 0617   RBC 4.23 02/03/2023 0617   HGB 12.1 02/03/2023 0617   HCT 35.9 (L) 02/03/2023 0617   PLT 248 02/03/2023 0617   MCV 84.9 02/03/2023 0617   MCH 28.6 02/03/2023 0617   MCHC 33.7 02/03/2023 0617   RDW 12.3 02/03/2023 0617   LYMPHSABS 1.2  02/03/2023 0617   MONOABS 0.6 02/03/2023 0617   EOSABS 0.3 02/03/2023 0617   BASOSABS 0.0 02/03/2023 0617    Basic Metabolic Panel:    Latest Ref Rng & Units 02/03/2023    6:17 AM 02/01/2023    5:35 AM 01/30/2023    6:34 AM  BMP  Glucose 70 - 99 mg/dL 161  096  045   BUN 8 - 23 mg/dL 8  14  9    Creatinine 0.44 - 1.00 mg/dL 4.09  8.11  9.14   Sodium 135 - 145 mmol/L 140  138  139   Potassium 3.5 - 5.1 mmol/L 3.8  3.9  4.0  Chloride 98 - 111 mmol/L 106  103  103   CO2 22 - 32 mmol/L 27  27  26    Calcium  8.9 - 10.3 mg/dL 8.6  8.8  8.6     Recent Results (from the past 240 hours)  Culture, blood (routine x 2)     Status: None   Collection Time: 01/29/23  8:07 PM   Specimen: BLOOD  Result Value Ref Range Status   Specimen Description BLOOD LEFT ANTECUBITAL  Final   Special Requests   Final    BOTTLES DRAWN AEROBIC AND ANAEROBIC Blood Culture results may not be optimal due to an inadequate volume of blood received in culture bottles   Culture   Final    NO GROWTH 5 DAYS Performed at [facility], [address] [place], [place] [address]    Report Status 02/03/2023 FINAL  Final  Culture, blood (routine x 2)     Status: None   Collection Time: 01/29/23  8:12 PM   Specimen: BLOOD  Result Value Ref Range Status   Specimen Description BLOOD BLOOD LEFT HAND  Final   Special Requests   Final    BOTTLES DRAWN AEROBIC AND ANAEROBIC Blood Culture adequate volume   Culture   Final    NO GROWTH 5 DAYS Performed at [facility], [address] [place], [place] [address]    Report Status 02/03/2023 FINAL  Final  Aerobic/Anaerobic Culture w Gram Stain (surgical/deep wound)     Status: None (Preliminary result)   Collection Time: 02/01/23  4:35 PM   Specimen: Path fluid; Body Fluid  Result Value Ref Range Status   Specimen Description ABSCESS ABDOMEN  Final   Special Requests PT ON VANC ROCEPHIN   Final   Gram Stain   Final    FEW WBC PRESENT, PREDOMINANTLY PMN FEW GRAM  POSITIVE COCCI IN PAIRS IN CLUSTERS Performed at [facility], [address] [place], [place] [address]    Culture   Final    FEW STAPHYLOCOCCUS AUREUS SUSCEPTIBILITIES TO FOLLOW NO ANAEROBES ISOLATED; CULTURE IN PROGRESS FOR 5 DAYS    Report Status PENDING  Incomplete    Radiology Studies: No results found.  Scheduled Meds:  atorvastatin   10 mg Oral Daily   buPROPion   300 mg Oral Daily   enoxaparin  (LOVENOX ) injection  40 mg Subcutaneous Daily   FLUoxetine   40 mg Oral Daily   hyaluronidase  Human  150 Units Subcutaneous Once   pantoprazole   40 mg Oral Daily   traZODone   100 mg Oral QHS   Continuous Infusions:  vancomycin  250 mL/hr at 02/03/23 2113      LOS: 4 days       Junita Oliva, MD [facility] 02/03/2023 7:45 PM   If 7PM-7AM, please contact night-coverage

## 2023-02-04 DIAGNOSIS — L02219 Cutaneous abscess of trunk, unspecified: Secondary | ICD-10-CM | POA: Diagnosis not present

## 2023-02-04 DIAGNOSIS — L03319 Cellulitis of trunk, unspecified: Secondary | ICD-10-CM | POA: Diagnosis not present

## 2023-02-04 MED ORDER — CLINDAMYCIN HCL 300 MG PO CAPS: 300.0000 mg | ORAL_CAPSULE | Freq: Three times a day (TID) | ORAL | 0 refills | Status: AC

## 2023-02-06 LAB — AEROBIC/ANAEROBIC CULTURE W GRAM STAIN (SURGICAL/DEEP WOUND)

## 2023-02-11 DIAGNOSIS — Z09 Encounter for follow-up examination after completed treatment for conditions other than malignant neoplasm: Secondary | ICD-10-CM | POA: Diagnosis not present

## 2023-02-11 DIAGNOSIS — A4902 Methicillin resistant Staphylococcus aureus infection, unspecified site: Secondary | ICD-10-CM | POA: Diagnosis not present

## 2023-02-11 DIAGNOSIS — L0291 Cutaneous abscess, unspecified: Secondary | ICD-10-CM | POA: Diagnosis not present

## 2023-02-23 DIAGNOSIS — L02211 Cutaneous abscess of abdominal wall: Secondary | ICD-10-CM | POA: Diagnosis not present

## 2023-02-23 DIAGNOSIS — E785 Hyperlipidemia, unspecified: Secondary | ICD-10-CM | POA: Diagnosis not present

## 2023-03-02 DIAGNOSIS — L03311 Cellulitis of abdominal wall: Secondary | ICD-10-CM | POA: Diagnosis not present

## 2023-03-03 ENCOUNTER — Observation Stay (HOSPITAL_COMMUNITY)
Admission: EM | Admit: 2023-03-03 | Discharge: 2023-03-05 | Disposition: A | Discharge status: Home / Self Care | Payer: Medicare HMO | Attending: Internal Medicine | Admitting: Internal Medicine

## 2023-03-03 ENCOUNTER — Encounter (HOSPITAL_COMMUNITY): Payer: Self-pay | Admitting: *Deleted

## 2023-03-03 ENCOUNTER — Other Ambulatory Visit: Payer: Self-pay

## 2023-03-03 DIAGNOSIS — E785 Hyperlipidemia, unspecified: Secondary | ICD-10-CM | POA: Insufficient documentation

## 2023-03-03 DIAGNOSIS — S31109A Unspecified open wound of abdominal wall, unspecified quadrant without penetration into peritoneal cavity, initial encounter: Secondary | ICD-10-CM | POA: Diagnosis not present

## 2023-03-03 DIAGNOSIS — K449 Diaphragmatic hernia without obstruction or gangrene: Secondary | ICD-10-CM | POA: Diagnosis not present

## 2023-03-03 DIAGNOSIS — F32A Depression, unspecified: Secondary | ICD-10-CM | POA: Diagnosis not present

## 2023-03-03 DIAGNOSIS — K219 Gastro-esophageal reflux disease without esophagitis: Secondary | ICD-10-CM | POA: Insufficient documentation

## 2023-03-03 DIAGNOSIS — Z87891 Personal history of nicotine dependence: Secondary | ICD-10-CM | POA: Diagnosis not present

## 2023-03-03 DIAGNOSIS — L03311 Cellulitis of abdominal wall: Principal | ICD-10-CM | POA: Insufficient documentation

## 2023-03-03 DIAGNOSIS — K5730 Diverticulosis of large intestine without perforation or abscess without bleeding: Secondary | ICD-10-CM | POA: Diagnosis not present

## 2023-03-03 DIAGNOSIS — F411 Generalized anxiety disorder: Secondary | ICD-10-CM | POA: Diagnosis not present

## 2023-03-03 DIAGNOSIS — G4700 Insomnia, unspecified: Secondary | ICD-10-CM | POA: Insufficient documentation

## 2023-03-03 DIAGNOSIS — R1031 Right lower quadrant pain: Secondary | ICD-10-CM | POA: Diagnosis present

## 2023-03-03 DIAGNOSIS — L0390 Cellulitis, unspecified: Secondary | ICD-10-CM | POA: Diagnosis present

## 2023-03-03 DIAGNOSIS — E119 Type 2 diabetes mellitus without complications: Secondary | ICD-10-CM | POA: Diagnosis not present

## 2023-03-03 DIAGNOSIS — N179 Acute kidney failure, unspecified: Secondary | ICD-10-CM | POA: Diagnosis not present

## 2023-03-03 DIAGNOSIS — L02211 Cutaneous abscess of abdominal wall: Secondary | ICD-10-CM | POA: Diagnosis not present

## 2023-03-03 LAB — URINALYSIS, ROUTINE W REFLEX MICROSCOPIC
Bilirubin Urine: NEGATIVE
Glucose, UA: NEGATIVE mg/dL
Hgb urine dipstick: NEGATIVE
Ketones, ur: 15 mg/dL — AB
Nitrite: NEGATIVE
Protein, ur: NEGATIVE mg/dL
Specific Gravity, Urine: >1.03 — ABNORMAL HIGH (ref 1.005–1.030)
pH: 5.5 (ref 5.0–8.0)

## 2023-03-03 LAB — CBC WITH DIFFERENTIAL/PLATELET
Abs Immature Granulocytes: 0.02 10*3/uL (ref 0.00–0.07)
Basophils Absolute: 0 10*3/uL (ref 0.0–0.1)
Basophils Relative: 0 %
Eosinophils Absolute: 0.4 10*3/uL (ref 0.0–0.5)
Eosinophils Relative: 4 %
HCT: 42.2 % (ref 36.0–46.0)
Hemoglobin: 13.7 g/dL (ref 12.0–15.0)
Immature Granulocytes: 0 %
Lymphocytes Relative: 17 %
Lymphs Abs: 1.4 10*3/uL (ref 0.7–4.0)
MCH: 28.2 pg (ref 26.0–34.0)
MCHC: 32.5 g/dL (ref 30.0–36.0)
MCV: 86.8 fL (ref 80.0–100.0)
Monocytes Absolute: 0.9 10*3/uL (ref 0.1–1.0)
Monocytes Relative: 10 %
Neutro Abs: 5.7 10*3/uL (ref 1.7–7.7)
Neutrophils Relative %: 69 %
Platelets: 239 10*3/uL (ref 150–400)
RBC: 4.86 MIL/uL (ref 3.87–5.11)
RDW: 12.6 % (ref 11.5–15.5)
WBC: 8.3 10*3/uL (ref 4.0–10.5)
nRBC: 0 % (ref 0.0–0.2)

## 2023-03-03 LAB — URINALYSIS, MICROSCOPIC (REFLEX)

## 2023-03-03 LAB — BASIC METABOLIC PANEL
Anion gap: 9 (ref 5–15)
BUN: 13 mg/dL (ref 8–23)
CO2: 26 mmol/L (ref 22–32)
Calcium: 9.4 mg/dL (ref 8.9–10.3)
Chloride: 103 mmol/L (ref 98–111)
Creatinine, Ser: 1.17 mg/dL — ABNORMAL HIGH (ref 0.44–1.00)
GFR, Estimated: 50 mL/min — ABNORMAL LOW (ref >60)
Glucose, Bld: 144 mg/dL — ABNORMAL HIGH (ref 70–99)
Potassium: 4.1 mmol/L (ref 3.5–5.1)
Sodium: 138 mmol/L (ref 135–145)

## 2023-03-03 NOTE — ED Provider Triage Note (Signed)
Emergency Medicine Provider Triage Evaluation Note  Karla Watson , a 73 y.o. female  was evaluated in triage.  Pt complains of Abd abscess, had debridement approx. 1 month ago similar Sx again.  Review of Systems  Positive: Pain, redness, chills, diarrhea x today Negative: Fevers, N/V, cough congestion  Physical Exam  LMP 01/20/2007  Gen:   Awake, no distress   Resp:  Normal effort  MSK:   Moves extremities without difficulty  Other:  10 cm area of erythema, area of necrosis from previous infection site, new approximately 1 cm necrotic area with purulent area noted. No fluctuance   Medical Decision Making  Medically screening exam initiated at 8:06 PM.  Appropriate orders placed.  Shazia Mitchener was informed that the remainder of the evaluation will be completed by another provider, this initial triage assessment does not replace that evaluation, and the importance of remaining in the ED until their evaluation is complete.  Labs ordered   Gretta Began 03/03/23 2017

## 2023-03-03 NOTE — ED Triage Notes (Signed)
Hx of mersa

## 2023-03-03 NOTE — ED Triage Notes (Signed)
The pt had rt abd surgery jan 11th  she has an open are to the rt abd and a smaller area below the largest one that is draining

## 2023-03-04 ENCOUNTER — Encounter (HOSPITAL_COMMUNITY): Payer: Self-pay | Admitting: Internal Medicine

## 2023-03-04 ENCOUNTER — Emergency Department (HOSPITAL_COMMUNITY): Payer: Medicare HMO

## 2023-03-04 DIAGNOSIS — F5101 Primary insomnia: Secondary | ICD-10-CM

## 2023-03-04 DIAGNOSIS — E785 Hyperlipidemia, unspecified: Secondary | ICD-10-CM | POA: Insufficient documentation

## 2023-03-04 DIAGNOSIS — L03311 Cellulitis of abdominal wall: Secondary | ICD-10-CM | POA: Diagnosis not present

## 2023-03-04 DIAGNOSIS — E7849 Other hyperlipidemia: Secondary | ICD-10-CM | POA: Diagnosis not present

## 2023-03-04 DIAGNOSIS — K449 Diaphragmatic hernia without obstruction or gangrene: Secondary | ICD-10-CM | POA: Diagnosis not present

## 2023-03-04 DIAGNOSIS — F321 Major depressive disorder, single episode, moderate: Secondary | ICD-10-CM | POA: Diagnosis not present

## 2023-03-04 DIAGNOSIS — L0390 Cellulitis, unspecified: Secondary | ICD-10-CM | POA: Diagnosis present

## 2023-03-04 DIAGNOSIS — L02211 Cutaneous abscess of abdominal wall: Secondary | ICD-10-CM | POA: Diagnosis not present

## 2023-03-04 DIAGNOSIS — N179 Acute kidney failure, unspecified: Secondary | ICD-10-CM | POA: Diagnosis not present

## 2023-03-04 DIAGNOSIS — K219 Gastro-esophageal reflux disease without esophagitis: Secondary | ICD-10-CM | POA: Insufficient documentation

## 2023-03-04 DIAGNOSIS — G4700 Insomnia, unspecified: Secondary | ICD-10-CM | POA: Insufficient documentation

## 2023-03-04 DIAGNOSIS — K5730 Diverticulosis of large intestine without perforation or abscess without bleeding: Secondary | ICD-10-CM | POA: Diagnosis not present

## 2023-03-04 LAB — LACTIC ACID, PLASMA: Lactic Acid, Venous: 0.7 mmol/L (ref 0.5–1.9)

## 2023-03-04 LAB — PROCALCITONIN: Procalcitonin: <0.1 ng/mL

## 2023-03-04 LAB — C-REACTIVE PROTEIN: CRP: 3.6 mg/dL — ABNORMAL HIGH (ref <1.0)

## 2023-03-04 MED ORDER — FENTANYL CITRATE PF 50 MCG/ML IJ SOSY
50.0000 ug | PREFILLED_SYRINGE | Freq: Once | INTRAMUSCULAR | Status: AC
  Administered 2023-03-04: 50 ug via INTRAVENOUS
  Filled 2023-03-04: qty 1

## 2023-03-04 MED ORDER — VANCOMYCIN HCL 750 MG/150ML IV SOLN
750.0000 mg | INTRAVENOUS | Status: DC
  Administered 2023-03-05: 750 mg via INTRAVENOUS
  Filled 2023-03-04 (×2): qty 150

## 2023-03-04 MED ORDER — TRAMADOL HCL 50 MG PO TABS: 50.0000 mg | ORAL_TABLET | Freq: Four times a day (QID) | ORAL | Status: DC | PRN

## 2023-03-04 MED ORDER — SODIUM CHLORIDE 0.9 % IV SOLN: 250.0000 mL | INTRAVENOUS | Status: DC | PRN

## 2023-03-04 MED ORDER — ACETAMINOPHEN 650 MG RE SUPP: 650.0000 mg | Freq: Four times a day (QID) | RECTAL | Status: DC | PRN

## 2023-03-04 MED ORDER — CHLORHEXIDINE GLUCONATE CLOTH 2 % EX PADS
6.0000 | MEDICATED_PAD | Freq: Every day | CUTANEOUS | Status: DC
Start: 2023-03-04 — End: 2023-03-05
  Administered 2023-03-04 – 2023-03-05 (×2): 6 via TOPICAL

## 2023-03-04 MED ORDER — IOHEXOL 350 MG/ML SOLN
75.0000 mL | Freq: Once | INTRAVENOUS | Status: AC | PRN
  Administered 2023-03-04: 75 mL via INTRAVENOUS

## 2023-03-04 MED ORDER — ORAL CARE MOUTH RINSE: 15.0000 mL | OROMUCOSAL | Status: DC | PRN

## 2023-03-04 MED ORDER — MUPIROCIN 2 % EX OINT
1.0000 | TOPICAL_OINTMENT | Freq: Two times a day (BID) | CUTANEOUS | Status: DC
  Administered 2023-03-04 – 2023-03-05 (×2): 1 via NASAL
  Filled 2023-03-04 (×2): qty 22

## 2023-03-04 MED ORDER — ACETAMINOPHEN 325 MG PO TABS
650.0000 mg | ORAL_TABLET | Freq: Four times a day (QID) | ORAL | Status: DC | PRN
  Administered 2023-03-04 – 2023-03-05 (×2): 650 mg via ORAL
  Filled 2023-03-04 (×2): qty 2

## 2023-03-04 MED ORDER — ATORVASTATIN CALCIUM 10 MG PO TABS
10.0000 mg | ORAL_TABLET | Freq: Every day | ORAL | Status: DC
  Administered 2023-03-04 – 2023-03-05 (×2): 10 mg via ORAL
  Filled 2023-03-04 (×2): qty 1

## 2023-03-04 MED ORDER — VANCOMYCIN HCL 2000 MG/400ML IV SOLN
2000.0000 mg | Freq: Once | INTRAVENOUS | Status: AC
Start: 2023-03-04 — End: 2023-03-04
  Administered 2023-03-04: 2000 mg via INTRAVENOUS
  Filled 2023-03-04: qty 400

## 2023-03-04 MED ORDER — FLUOXETINE HCL 20 MG PO CAPS
40.0000 mg | ORAL_CAPSULE | Freq: Every day | ORAL | Status: DC
  Administered 2023-03-04 – 2023-03-05 (×2): 40 mg via ORAL
  Filled 2023-03-04 (×2): qty 2

## 2023-03-04 MED ORDER — ENOXAPARIN SODIUM 40 MG/0.4ML IJ SOSY
40.0000 mg | PREFILLED_SYRINGE | INTRAMUSCULAR | Status: DC
  Administered 2023-03-05: 40 mg via SUBCUTANEOUS
  Filled 2023-03-04: qty 0.4

## 2023-03-04 MED ORDER — TRAZODONE HCL 100 MG PO TABS
100.0000 mg | ORAL_TABLET | Freq: Every day | ORAL | Status: DC
  Administered 2023-03-04: 100 mg via ORAL
  Filled 2023-03-04: qty 1

## 2023-03-04 MED ORDER — LACTATED RINGERS IV SOLN: INTRAVENOUS | Status: DC

## 2023-03-04 MED ORDER — BUPROPION HCL ER (XL) 150 MG PO TB24
300.0000 mg | ORAL_TABLET | Freq: Every day | ORAL | Status: DC
  Administered 2023-03-04: 300 mg via ORAL
  Filled 2023-03-04 (×2): qty 2

## 2023-03-04 MED ORDER — PANTOPRAZOLE SODIUM 40 MG PO TBEC
40.0000 mg | DELAYED_RELEASE_TABLET | Freq: Every day | ORAL | Status: DC
  Administered 2023-03-04 – 2023-03-05 (×2): 40 mg via ORAL
  Filled 2023-03-04 (×2): qty 1

## 2023-03-04 MED ORDER — SODIUM CHLORIDE 0.9% FLUSH: 3.0000 mL | Freq: Two times a day (BID) | INTRAVENOUS | Status: DC

## 2023-03-04 MED ORDER — HYDROCODONE-ACETAMINOPHEN 5-325 MG PO TABS
1.0000 | ORAL_TABLET | Freq: Once | ORAL | Status: AC
  Administered 2023-03-04: 1 via ORAL
  Filled 2023-03-04: qty 1

## 2023-03-04 MED ORDER — ONDANSETRON HCL 4 MG PO TABS: 4.0000 mg | ORAL_TABLET | Freq: Four times a day (QID) | ORAL | Status: DC | PRN

## 2023-03-04 MED ORDER — SODIUM CHLORIDE 0.9% FLUSH: 3.0000 mL | INTRAVENOUS | Status: DC | PRN

## 2023-03-04 MED ORDER — ONDANSETRON HCL 4 MG/2ML IJ SOLN
4.0000 mg | Freq: Four times a day (QID) | INTRAMUSCULAR | Status: DC | PRN
Start: 2023-03-04 — End: 2023-03-05

## 2023-03-04 NOTE — Care Management CC44 (Signed)
Condition Code 44 Documentation Completed  Patient Details  Name: Karla Watson MRN: [id number] Date of Birth: September 01, 1950   Condition Code 44 given:  Yes Patient signature on Condition Code 44 notice:  Yes Documentation of 2 MD's agreement:  Yes Code 44 added to claim:  Yes    Lockie Pares, RN 03/04/2023, 12:04 PM

## 2023-03-04 NOTE — Care Management (Signed)
Transition of Care [facility]) - Inpatient Brief Assessment   Patient Details  Name: Karla Watson MRN: [id number] Date of Birth: [birth year]  [facility] of [facility] CM/SW Contact:    Lockie Pares, RN Phone Number: 03/04/2023, 1:32 PM   Clinical Narrative:   73 year old patient presented with abdominal cellulitis. Patient requests FC be contacted to see what programs are available to assist her as she has been in OBV to the hospital several times. She currently has [employer], but only income is SSI, wondering what programs she qualifies for. Sent FC secure message via email.  Transition of Care Asessment: Insurance and Status: Insurance coverage has been reviewed Patient has primary care physician: Yes Home environment has been reviewed: apartment with spouse Prior level of function:: independent Prior/Current Home Services: No current home services Social Drivers of Health Review: SDOH reviewed no interventions necessary Readmission risk has been reviewed: Yes Transition of care needs: transition of care needs identified, TOC will continue to follow

## 2023-03-04 NOTE — Hospital Course (Addendum)
73 year old female history of generalized depression, nephrolithiasis, renal cyst, insomnia, GERD, hyperlipidemia and recurrent abdominal wall cellulitis and abscess presents emergency department complaining of right-sided lower abdominal wall pain, redness and swelling for last 4 to 5 days.  Patient has been seen PCP 2 days ago received oral doxycycline without much improvement of the abdominal wall infection.  Tonight patient coming with worsening pain, redness and swelling but there is no drainage or bleeding.  Patient denies any fever and chill. Patient has been previously underwent incision drainage of the abdominal wall abscess in January 2025. At presentation to ED patient is hemodynamically stable. CBC unremarkable evidence of leukocytosis. BMP showing elevated creatinine 1.17 otherwise unremarkable. UA increased specific gravity, leukocyte esterase positive, ketone positive and rare bacteria present.  Pending lactic acid level and blood cultures. CT abdomen pelvis no drainable abscess.  Similar right lateral wall cellulitis.  No acute intra-abdominal abnormality.  Physical exam no evidence of abscess formation.  In the ED patient has been given fentanyl and vancomycin. Hospitalist has been contacted for further management of abdominal wall cellulitis and acute kidney injury.

## 2023-03-04 NOTE — Care Management Obs Status (Signed)
MEDICARE OBSERVATION STATUS NOTIFICATION   Patient Details  Name: Karla Watson MRN: [id number] Date of Birth: [birth year]   Medicare Observation Status Notification Given:  Yes    Lockie Pares, RN 03/04/2023, 12:04 PM

## 2023-03-04 NOTE — Progress Notes (Signed)
ED Pharmacy Antibiotic Sign Off An antibiotic consult was received from an ED provider for vancomycin per pharmacy dosing for cellulitis. A chart review was completed to assess appropriateness.   The following one time order(s) were placed:   -Vancomycin 2g IV x1  Further antibiotic and/or antibiotic pharmacy consults should be ordered by the admitting provider if indicated.   Thank you for allowing pharmacy to be a part of this patient's care.   Arabella Merles, [facility]  Clinical Pharmacist 03/04/23 6:10 AM

## 2023-03-04 NOTE — H&P (Addendum)
History and Physical    Karla Watson WGN:[id number] DOB: 08-22-1950 DOA: 03/03/2023  PCP: Farris Has, MD   Patient coming from: Home   Chief Complaint:  Chief Complaint  Patient presents with   Abscess   ED TRIAGE note:The pt had rt abd surgery jan 11th she has an open are to the rt abd and a smaller area below the largest one that is draining   HPI:  Karla Watson is a 73 y.o. female with medical history significant depression, nephrolithiasis, renal cyst, insomnia, GERD, hyperlipidemia and recurrent abdominal wall cellulitis and abscess presents emergency department complaining of right-sided lower abdominal wall pain, redness and swelling for last 4 to 5 days.  Patient has been seen PCP 2 days ago received oral doxycycline without much improvement of the abdominal wall infection.  Tonight patient coming with worsening pain, redness and swelling but there is no drainage or bleeding.  Patient denies any fever and chill. Patient has been previously underwent incision drainage of the abdominal wall abscess in January 2025. At presentation to ED patient is hemodynamically stable. CBC unremarkable evidence of leukocytosis. BMP showing elevated creatinine 1.17 otherwise unremarkable. UA increased specific gravity, leukocyte esterase positive, ketone positive and rare bacteria present.  Pending lactic acid level and blood cultures. CT abdomen pelvis no drainable abscess.  Similar right lateral wall cellulitis.  No acute intra-abdominal abnormality.  Physical exam no evidence of abscess formation.  In the ED patient has been given fentanyl and vancomycin. Hospitalist has been contacted for further management of abdominal wall cellulitis and acute kidney injury.      Significant labs in the ED: Lab Orders         Blood culture (routine x 2)         Basic metabolic panel         CBC with Differential         Urinalysis, Routine w reflex microscopic -Urine, Clean Catch          Urinalysis, Microscopic (reflex)         Lactic acid, plasma         Comprehensive metabolic panel         CBC         CBC         Comprehensive metabolic panel         Creatinine, urine, random         Sodium, urine, random       Review of Systems:  Review of Systems  Constitutional:  Negative for chills, fever, malaise/fatigue and weight loss.  Respiratory:  Negative for cough and shortness of breath.   Cardiovascular:  Negative for chest pain.  Gastrointestinal:  Negative for abdominal pain, diarrhea, heartburn, nausea and vomiting.  Musculoskeletal:  Negative for myalgias.  Neurological:  Negative for dizziness and headaches.  Psychiatric/Behavioral:  The patient is not nervous/anxious.   All other systems reviewed and are negative.   Past Medical History:  Diagnosis Date   Arthritis    oa hands and left foot   Depression    History of kidney stones    Insomnia    Kidney abscess 2012   Pleurisy 12/2018   tx with antibiotics   Sepsis (HCC)    in [place] from 03-07-2020 to 03-09-2020   Urinary frequency    Vertigo occ   Wears glasses     Past Surgical History:  Procedure Laterality Date   CATARACT EXTRACTION Bilateral yrs ago   CEREBRAL ANEURYSM REPAIR  15 yrs ago   no problems after coiling done   CYSTOSCOPY W/ URETERAL STENT PLACEMENT Left 03/07/2020   Procedure: CYSTOSCOPY WITH RETROGRADE PYELOGRAM/URETERAL STENT PLACEMENT;  Surgeon: Jannifer Hick, MD;  Location: [facility] OR;  Service: Urology;  Laterality: Left;   CYSTOSCOPY/URETEROSCOPY/HOLMIUM LASER/STENT PLACEMENT Left 04/19/2020   Procedure: CYSTOSCOPY/RETROGRADE/URETEROSCOPY/HOLMIUM LASER/STENT PLACEMENT;  Surgeon: Jannifer Hick, MD;  Location: [facility];  Service: Urology;  Laterality: Left;  ONLY NEEDS 60 MIN   IRRIGATION AND DEBRIDEMENT ABSCESS N/A 02/01/2023   Procedure: IRRIGATION AND DEBRIDEMENT ABSCESS ABDOMINAL WALL;  Surgeon: Andria Meuse, MD;  Location: [facility] OR;  Service: General;   Laterality: N/A;   TUBAL LIGATION  yrs ago     reports that she quit smoking about 21 years ago. Her smoking use included cigarettes. She started smoking about 51 years ago. She has a 30 pack-year smoking history. She has never used smokeless tobacco. She reports that she does not currently use alcohol. She reports that she does not use drugs.  Allergies  Allergen Reactions   Erythromycin Nausea And Vomiting    Family History  Problem Relation Age of Onset   Breast cancer Mother    Prostate cancer Father     Prior to Admission medications   Medication Sig Start Date End Date Taking? Authorizing Provider  atorvastatin (LIPITOR) 10 MG tablet Take 10 mg by mouth daily. 01/08/23   [provider]  buPROPion (WELLBUTRIN XL) 300 MG 24 hr tablet Take 300 mg by mouth daily.    [provider]  cholecalciferol (VITAMIN D3) 25 MCG (1000 UNIT) tablet Take 1,000 Units by mouth daily.    [provider]  FLUoxetine (PROZAC) 40 MG capsule Take 40 mg by mouth daily.    [provider]  Multiple Vitamin (MULTIVITAMIN) tablet Take 1 tablet by mouth daily.    [provider]  omeprazole (PRILOSEC) 20 MG capsule Take 20 mg by mouth daily.    [provider]  traMADol (ULTRAM) 50 MG tablet Take 1 tablet (50 mg total) by mouth every 6 (six) hours as needed (pain). 02/02/23   Barnetta Chapel, PA-C  traZODone (DESYREL) 100 MG tablet Take 100 mg by mouth at bedtime.    [provider]     Physical Exam: Vitals:   03/03/23 2017 03/04/23 0414  BP:  (!) 136/52  Pulse:  61  Resp:  15  Temp:  98.3 F (36.8 C)  TempSrc:  Oral  SpO2:  98%  Weight: 82.1 kg   Height: 5\' 3"  (1.6 m)     Physical Exam Vitals and nursing note reviewed.  HENT:     Mouth/Throat:     Mouth: Mucous membranes are moist.  Eyes:     Conjunctiva/sclera: Conjunctivae normal.  Cardiovascular:     Rate and Rhythm: Normal rate and regular rhythm.     Pulses: Normal  pulses.     Heart sounds: Normal heart sounds.  Pulmonary:     Effort: Pulmonary effort is normal.     Breath sounds: Normal breath sounds.  Abdominal:     General: Bowel sounds are normal.     Comments: Right-sided abdominal wall cellulitis.Marland Kitchen  However deep palpation showed that there is some hardening underneath the superficial abscess.  Musculoskeletal:     Cervical back: Neck supple.     Right lower leg: No edema.     Left lower leg: No edema.  Skin:    Capillary Refill: Capillary refill takes less than  2 seconds.  Neurological:     Mental Status: She is alert and oriented to person, place, and time.  Psychiatric:        Mood and Affect: Mood normal.      Media Information   Document Information  Photos    03/04/2023 01:44  Attached To:  Hospital Encounter on 03/03/23  Source Information  [facility], [place], [place]  [facility]  Document History     Labs on Admission: I have personally reviewed following labs and imaging studies  CBC: Recent Labs  Lab 03/03/23 2018  WBC 8.3  NEUTROABS 5.7  HGB 13.7  HCT 42.2  MCV 86.8  PLT 239   Basic Metabolic Panel: Recent Labs  Lab 03/03/23 2018  NA 138  K 4.1  CL 103  CO2 26  GLUCOSE 144*  BUN 13  CREATININE 1.17*  CALCIUM 9.4   GFR: Estimated Creatinine Clearance: 45.4 mL/min (A) (by C-G formula based on SCr of 1.17 mg/dL (H)). Liver Function Tests: No results for input(s): "AST", "ALT", "ALKPHOS", "BILITOT", "PROT", "ALBUMIN" in the last 168 hours. No results for input(s): "LIPASE", "AMYLASE" in the last 168 hours. No results for input(s): "AMMONIA" in the last 168 hours. Coagulation Profile: No results for input(s): "INR", "PROTIME" in the last 168 hours. Cardiac Enzymes: No results for input(s): "CKTOTAL", "CKMB", "CKMBINDEX", "TROPONINI", "TROPONINIHS" in the last 168 hours. BNP (last 3 results) No results for input(s): "BNP" in the last 8760 hours. HbA1C: No results for input(s): "HGBA1C" in the  last 72 hours. CBG: No results for input(s): "GLUCAP" in the last 168 hours. Lipid Profile: No results for input(s): "CHOL", "HDL", "LDLCALC", "TRIG", "CHOLHDL", "LDLDIRECT" in the last 72 hours. Thyroid Function Tests: No results for input(s): "TSH", "T4TOTAL", "FREET4", "T3FREE", "THYROIDAB" in the last 72 hours. Anemia Panel: No results for input(s): "VITAMINB12", "FOLATE", "FERRITIN", "TIBC", "IRON", "RETICCTPCT" in the last 72 hours. Urine analysis:    Component Value Date/Time   COLORURINE YELLOW 03/03/2023 2046   APPEARANCEUR CLEAR 03/03/2023 2046   LABSPEC >1.030 (H) 03/03/2023 2046   PHURINE 5.5 03/03/2023 2046   GLUCOSEU NEGATIVE 03/03/2023 2046   HGBUR NEGATIVE 03/03/2023 2046   BILIRUBINUR NEGATIVE 03/03/2023 2046   KETONESUR 15 (A) 03/03/2023 2046   PROTEINUR NEGATIVE 03/03/2023 2046   NITRITE NEGATIVE 03/03/2023 2046   LEUKOCYTESUR SMALL (A) 03/03/2023 2046    Radiological Exams on Admission: I have personally reviewed images CT ABDOMEN PELVIS W CONTRAST Result Date: 03/04/2023 CLINICAL DATA:  Abdominal wall abscess. Abdominal surgery January 11th. Open area to the right abdominal wall. EXAM: CT ABDOMEN AND PELVIS WITH CONTRAST TECHNIQUE: Multidetector CT imaging of the abdomen and pelvis was performed using the standard protocol following bolus administration of intravenous contrast. RADIATION DOSE REDUCTION: This exam was performed according to the departmental dose-optimization program which includes automated exposure control, adjustment of the mA and/or kV according to patient size and/or use of iterative reconstruction technique. CONTRAST:  75mL OMNIPAQUE IOHEXOL 350 MG/ML SOLN COMPARISON:  01/29/2023 FINDINGS: Lower chest: No acute abnormality. Hepatobiliary: No acute abnormality. Pancreas: Unremarkable. Spleen: Unremarkable. Adrenals/Urinary Tract: Stable adrenal glands. No urinary calculi or hydronephrosis. Unremarkable bladder. Stomach/Bowel: Normal caliber large  and small bowel. Colonic diverticulosis without diverticulitis. Appendix is within normal limits. Small hiatal hernia. Vascular/Lymphatic: Aortic atherosclerosis. No enlarged abdominal or pelvic lymph nodes. Reproductive: Uterus and bilateral adnexa are unremarkable. Other: Loosely organized fluid and stranding in the right lateral abdominal wall is similar to prior. No drainable fluid collection. No soft tissue gas.  No free intraperitoneal fluid or air. Musculoskeletal: No acute fracture. IMPRESSION: 1. Similar right lateral abdominal wall cellulitis. No drainable abscess. 2. No acute intra-abdominal abnormality. 3. Aortic Atherosclerosis (ICD10-I70.0). Electronically Signed   By: Minerva Fester M.D.   On: 03/04/2023 02:52       Assessment/Plan: Principal Problem:   Cellulitis of abdominal wall Active Problems:   Abdominal wall cellulitis   AKI (acute kidney injury) (HCC)   Depression   Insomnia   GERD (gastroesophageal reflux disease)   Hyperlipidemia    Assessment and Plan: Cellulitis abdominal wall History of recurrent cellulitis and abscess of the abdominal wall -Patient had abdominal wall cellulitis and abscess underwent incision drainage in January 2025 afterward patient has been complete a long time course of antibiotic.  Per chart review previous blood culture no growth so far. Patient has been taking oral doxycycline for last 2 days without any improvement of cellulitis of the abdominal wall. - At presentation to ED patient is hemodynamically stable.  Afebrile.  No leukocytosis.  Pending lactic acid level.  There is no concern for sepsis at this time. -Abdomen pelvis similar right lateral abdominal cellulitis.  No drainable abscess. - Physical exam showing right sided abdominal wall cellulitis without any evidence of visible abscess formation however there is some hardening underneath the superficial cellulitis. - Consulting general surgery Dr. Freida Busman requesting for  evaluation. -Blood culture has been obtained in the ED. - In ED patient has been treated with IV vancomycin. - Plan to continue IV vancomycin and will follow-up with blood culture result for further antibiotic guidance. - Continue to monitor WBC count and fever curve. -Continue tramadol 50 mg every 6 hour as needed for moderate and severe pain.   Prerenal acute kidney injury - Creatinine 1.17 on presentation.  Baseline normal renal function.  Patient has multiple episodes of AKI in the past. - Blood blood pressure within good range. - Unclear etiology of acute kidney injury at this time. - UA increases specific gravity, few bacteria leukocyte esterase positive.  Patient denies any UTI signs symptom - Given UA showed increased specific gravity concern for mostly prerenal acute kidney injury. - Starting LR 75 cc/h. - Continue monitor renal function, avoid nephrotoxic agent and renally adjust medication.  Chronic depression Generalized anxiety disorder -Continue BuSpar 300 mg daily  Insomnia -Continue trazodone at bedtime  Hyperlipidemia Continue Lipitor.  GERD - Continue Protonix.  DVT prophylaxis:  Lovenox Code Status:  Full Code Diet: Heart healthy diet Family Communication:   No family member at bedside now Disposition Plan: Will follow-up with general surgery plan, blood culture results and continue IV vancomycin until blood cultures will be resulted. Consults: General Surgery Admission status:   Inpatient, Telemetry bed  Severity of Illness: The appropriate patient status for this patient is INPATIENT. Inpatient status is judged to be reasonable and necessary in order to provide the required intensity of service to ensure the patient's safety. The patient's presenting symptoms, physical exam findings, and initial radiographic and laboratory data in the context of their chronic comorbidities is felt to place them at high risk for further clinical deterioration. Furthermore,  it is not anticipated that the patient will be medically stable for discharge from the hospital within 2 midnights of admission.   * I certify that at the point of admission it is my clinical judgment that the patient will require inpatient hospital care spanning beyond 2 midnights from the point of admission due to high intensity of service, high risk for further  deterioration and high frequency of surveillance required.Marland Kitchen    Tereasa Coop, MD [facility]  How to contact the [facility] Attending or Consulting provider 7A - 7P or covering provider during after hours 7P -7A, for this patient.  Check the care team in [facility] and look for a) attending/consulting [facility] provider listed and b) the [facility] team listed Log into [email or web address] and use [facility] universal password to access. If you do not have the password, please contact the hospital operator. Locate the [facility] provider you are looking for under [facility] and page to a number that you can be directly reached. If you still have difficulty reaching the provider, please page the [facility] (Director on Call) for the Hospitalists listed on amion for assistance.  03/04/2023, 6:53 AM

## 2023-03-04 NOTE — Progress Notes (Signed)
Pharmacy Antibiotic Note  Karla Watson is a 73 y.o. female admitted on 03/03/2023 with abdominal wall cellulitis/abscess.  Pharmacy has been consulted for vancomycin dosing.  Given 2g IV vancomycin in ED x 1  Plan: Vancomycin 750 mg IV q 24h (eAUC 520) Monitor renal function, Cx and clinical progression to narrow Vancomycin levels as indicated  Height: 5\' 3"  (160 cm) Weight: 82.1 kg (181 lb) IBW/kg (Calculated) : 52.4  Temp (24hrs), Avg:98.4 F (36.9 C), Min:97.9 F (36.6 C), Max:98.8 F (37.1 C)  Recent Labs  Lab 03/03/23 2018 03/04/23 1509  WBC 8.3  --   CREATININE 1.17*  --   LATICACIDVEN  --  0.7    Estimated Creatinine Clearance: 45.4 mL/min (A) (by C-G formula based on SCr of 1.17 mg/dL (H)).    Allergies  Allergen Reactions   Erythromycin Nausea And Vomiting    Daylene Posey, PharmD, [facility] Clinical Pharmacist ED Pharmacist Phone # [phone] 03/04/2023 7:59 PM

## 2023-03-04 NOTE — Progress Notes (Signed)
Subjective: Patient known to our service as she recently underwent an incision and debridement of an abdominal wall abscess by Dr. Cliffton Asters on 1/13.  This wound is healing and is getting better, but several days ago she noticed a new area just inferior to this that started developing redness and pain.  She saw her PCP who started her on doxycycline.  However, it has continued and she was worried it would progress to be similar to the last incident.  Her last cultures grew MRSA.  She is currently AF with a normal WBC.  She underwent a CT scan that reveals cellulitis but no drainable abscess.  We have been asked to assess her for further surgical recommendations.  ROS: See above, otherwise other systems negative  Objective: Vital signs in last 24 hours: Temp:  [98.3 F (36.8 C)] 98.3 F (36.8 C) (02/13 0414) Pulse Rate:  [61] 61 (02/13 0414) Resp:  [15] 15 (02/13 0414) BP: (136)/(52) 136/52 (02/13 0414) SpO2:  [98 %] 98 % (02/13 0414) Weight:  [82.1 kg] 82.1 kg (02/12 2017)    Intake/Output from previous day: No intake/output data recorded. Intake/Output this shift: No intake/output data recorded.  PE: Gen: NAD Abd: soft, wound from previous I&D is almost healed.  It is 100% clean with beefy red granulation tissue present.  This is small in size.  Just inferior to this is induration that is about 2.5cm in length and about 1 cm in width.  There is a small 2mm scabbed area, but no drainage is noted from this.  There is no fluctuance or evidence of abscess.  There is cellulitis.   Lab Results:  Recent Labs    03/03/23 2018  WBC 8.3  HGB 13.7  HCT 42.2  PLT 239   BMET Recent Labs    03/03/23 2018  NA 138  K 4.1  CL 103  CO2 26  GLUCOSE 144*  BUN 13  CREATININE 1.17*  CALCIUM 9.4   PT/INR No results for input(s): "LABPROT", "INR" in the last 72 hours. CMP     Component Value Date/Time   NA 138 03/03/2023 2018   K 4.1 03/03/2023 2018   CL 103 03/03/2023 2018    CO2 26 03/03/2023 2018   GLUCOSE 144 (H) 03/03/2023 2018   BUN 13 03/03/2023 2018   CREATININE 1.17 (H) 03/03/2023 2018   CALCIUM 9.4 03/03/2023 2018   PROT 5.6 (L) 01/30/2023 0634   ALBUMIN 2.9 (L) 01/30/2023 0634   AST 12 (L) 01/30/2023 0634   ALT 12 01/30/2023 0634   ALKPHOS 52 01/30/2023 0634   BILITOT 0.9 01/30/2023 0634   GFRNONAA 50 (L) 03/03/2023 2018   GFRAA >60 12/22/2018 1453   Lipase     Component Value Date/Time   LIPASE 39 01/29/2023 1749       Studies/Results: CT ABDOMEN PELVIS W CONTRAST Result Date: 03/04/2023 CLINICAL DATA:  Abdominal wall abscess. Abdominal surgery January 11th. Open area to the right abdominal wall. EXAM: CT ABDOMEN AND PELVIS WITH CONTRAST TECHNIQUE: Multidetector CT imaging of the abdomen and pelvis was performed using the standard protocol following bolus administration of intravenous contrast. RADIATION DOSE REDUCTION: This exam was performed according to the departmental dose-optimization program which includes automated exposure control, adjustment of the mA and/or kV according to patient size and/or use of iterative reconstruction technique. CONTRAST:  75mL OMNIPAQUE IOHEXOL 350 MG/ML SOLN COMPARISON:  01/29/2023 FINDINGS: Lower chest: No acute abnormality. Hepatobiliary: No acute abnormality. Pancreas: Unremarkable. Spleen:  Unremarkable. Adrenals/Urinary Tract: Stable adrenal glands. No urinary calculi or hydronephrosis. Unremarkable bladder. Stomach/Bowel: Normal caliber large and small bowel. Colonic diverticulosis without diverticulitis. Appendix is within normal limits. Small hiatal hernia. Vascular/Lymphatic: Aortic atherosclerosis. No enlarged abdominal or pelvic lymph nodes. Reproductive: Uterus and bilateral adnexa are unremarkable. Other: Loosely organized fluid and stranding in the right lateral abdominal wall is similar to prior. No drainable fluid collection. No soft tissue gas. No free intraperitoneal fluid or air. Musculoskeletal: No  acute fracture. IMPRESSION: 1. Similar right lateral abdominal wall cellulitis. No drainable abscess. 2. No acute intra-abdominal abnormality. 3. Aortic Atherosclerosis (ICD10-I70.0). Electronically Signed   By: Minerva Fester M.D.   On: 03/04/2023 02:52    Anti-infectives: Anti-infectives (From admission, onward)    Start     Dose/Rate Route Frequency Ordered Stop   03/04/23 0630  vancomycin (VANCOREADY) IVPB 2000 mg/400 mL        2,000 mg 200 mL/hr over 120 Minutes Intravenous  Once 03/04/23 1610          Assessment/Plan Abdominal wall cellulitis, h/o I&D of abdominal wall abscess 1/13 for MRSA -no evidence of new abscess or area that needs surgical intervention -this appears to be cellulitis at this time.  Previous culture in this area was MRSA so agree with vancomycin. -hopefully this will allow this area to improve and not progress -no surgical needs at this time.  Continue to treat medically -we are available if this worsens and surgical intervention needed.     FEN - HH VTE - lovenox ID - vancomycin  I reviewed hospitalist notes, last 24 h vitals and pain scores, last 48 h intake and output, last 24 h labs and trends, and last 24 h imaging results.   LOS: 0 days    Karla Watson , [facility] Surgery 03/04/2023, 8:19 AM Please see Amion for pager number during day hours 7:00am-4:30pm or 7:00am -11:30am on weekends

## 2023-03-04 NOTE — ED Provider Notes (Signed)
Karla Watson EMERGENCY DEPARTMENT AT [facility] Provider Note   CSN: [id number] Arrival date & time: 03/03/23  1958     History  Chief Complaint  Patient presents with   Abscess    Karla Watson is a 73 y.o. female.  Patient with a history of diabetes, kidney stones, recent admission for abdominal wall cellulitis requiring surgical incision and drainage here with worsening pain, redness and swelling to her right lower abdomen times the past 4 to 5 days.  She saw her PCP 2 days ago was given a prescription for doxycycline of which she has taken for 5 doses.  Comes in tonight with worsening pain, redness, swelling but no drainage or bleeding.  Denies fevers, chills, nausea or vomiting but does feel ill.  No chest pain or shortness of breath.  States she believes the area is worsening since being started on doxycycline and she is concerned she is having another abscess.  Denies blood thinner use.   Abscess Associated symptoms: no fever, no headaches, no nausea and no vomiting        Home Medications Prior to Admission medications   Medication Sig Start Date End Date Taking? Authorizing Provider  atorvastatin (LIPITOR) 10 MG tablet Take 10 mg by mouth daily. 01/08/23   [provider]  buPROPion (WELLBUTRIN XL) 300 MG 24 hr tablet Take 300 mg by mouth daily.    [provider]  cholecalciferol (VITAMIN D3) 25 MCG (1000 UNIT) tablet Take 1,000 Units by mouth daily.    [provider]  FLUoxetine (PROZAC) 40 MG capsule Take 40 mg by mouth daily.    [provider]  Multiple Vitamin (MULTIVITAMIN) tablet Take 1 tablet by mouth daily.    [provider]  omeprazole (PRILOSEC) 20 MG capsule Take 20 mg by mouth daily.    [provider]  traMADol (ULTRAM) 50 MG tablet Take 1 tablet (50 mg total) by mouth every 6 (six) hours as needed (pain). 02/02/23   Barnetta Chapel, PA-C  traZODone (DESYREL) 100 MG tablet Take 100 mg by mouth  at bedtime.    [provider]      Allergies    Erythromycin    Review of Systems   Review of Systems  Constitutional:  Negative for activity change, appetite change and fever.  HENT:  Negative for congestion and rhinorrhea.   Respiratory:  Negative for cough, chest tightness and shortness of breath.   Cardiovascular:  Negative for chest pain.  Gastrointestinal:  Positive for abdominal pain. Negative for nausea and vomiting.  Genitourinary:  Negative for dysuria and hematuria.  Skin:  Positive for rash and wound.  Neurological:  Negative for dizziness, weakness and headaches.   all other systems are negative except as noted in the HPI and PMH.    Physical Exam Updated Vital Signs BP (!) 136/52 (BP Location: Left Arm)   Pulse 61   Temp 98.3 F (36.8 C) (Oral)   Resp 15   Ht 5\' 3"  (1.6 m)   Wt 82.1 kg   LMP 01/20/2007   SpO2 98%   BMI 32.06 kg/m  Physical Exam Vitals and nursing note reviewed.  Constitutional:      General: She is not in acute distress.    Appearance: She is well-developed.  HENT:     Head: Normocephalic and atraumatic.     Mouth/Throat:     Pharynx: No oropharyngeal exudate.  Eyes:     Conjunctiva/sclera: Conjunctivae normal.     Pupils:  Pupils are equal, round, and reactive to light.  Neck:     Comments: No meningismus. Cardiovascular:     Rate and Rhythm: Normal rate and regular rhythm.     Heart sounds: Normal heart sounds. No murmur heard. Pulmonary:     Effort: Pulmonary effort is normal. No respiratory distress.     Breath sounds: Normal breath sounds.  Abdominal:     Palpations: Abdomen is soft.     Tenderness: There is abdominal tenderness. There is no guarding or rebound.     Comments: Cellulitis and erythema of abdominal wall as depicted.  Healing open wound to mid abdomen with surrounding erythema.  Induration but no fluctuance.  Musculoskeletal:        General: No tenderness. Normal range of motion.     Cervical back:  Normal range of motion and neck supple.  Skin:    General: Skin is warm.  Neurological:     Mental Status: She is alert and oriented to person, place, and time.     Cranial Nerves: No cranial nerve deficit.     Motor: No abnormal muscle tone.     Coordination: Coordination normal.     Comments:  5/5 strength throughout. CN 2-12 intact.Equal grip strength.   Psychiatric:        Behavior: Behavior normal.     ED Results / Procedures / Treatments   Labs (all labs ordered are listed, but only abnormal results are displayed) Labs Reviewed  BASIC METABOLIC PANEL - Abnormal; Notable for the following components:      Result Value   Glucose, Bld 144 (*)    Creatinine, Ser 1.17 (*)    GFR, Estimated 50 (*)    All other components within normal limits  URINALYSIS, ROUTINE W REFLEX MICROSCOPIC - Abnormal; Notable for the following components:   Specific Gravity, Urine >1.030 (*)    Ketones, ur 15 (*)    Leukocytes,Ua SMALL (*)    All other components within normal limits  URINALYSIS, MICROSCOPIC (REFLEX) - Abnormal; Notable for the following components:   Bacteria, UA RARE (*)    All other components within normal limits  CULTURE, BLOOD (ROUTINE X 2)  CULTURE, BLOOD (ROUTINE X 2)  CBC WITH DIFFERENTIAL/PLATELET  LACTIC ACID, PLASMA  LACTIC ACID, PLASMA    EKG None  Radiology CT ABDOMEN PELVIS W CONTRAST Result Date: 03/04/2023 CLINICAL DATA:  Abdominal wall abscess. Abdominal surgery January 11th. Open area to the right abdominal wall. EXAM: CT ABDOMEN AND PELVIS WITH CONTRAST TECHNIQUE: Multidetector CT imaging of the abdomen and pelvis was performed using the standard protocol following bolus administration of intravenous contrast. RADIATION DOSE REDUCTION: This exam was performed according to the departmental dose-optimization program which includes automated exposure control, adjustment of the mA and/or kV according to patient size and/or use of iterative reconstruction  technique. CONTRAST:  75mL OMNIPAQUE IOHEXOL 350 MG/ML SOLN COMPARISON:  01/29/2023 FINDINGS: Lower chest: No acute abnormality. Hepatobiliary: No acute abnormality. Pancreas: Unremarkable. Spleen: Unremarkable. Adrenals/Urinary Tract: Stable adrenal glands. No urinary calculi or hydronephrosis. Unremarkable bladder. Stomach/Bowel: Normal caliber large and small bowel. Colonic diverticulosis without diverticulitis. Appendix is within normal limits. Small hiatal hernia. Vascular/Lymphatic: Aortic atherosclerosis. No enlarged abdominal or pelvic lymph nodes. Reproductive: Uterus and bilateral adnexa are unremarkable. Other: Loosely organized fluid and stranding in the right lateral abdominal wall is similar to prior. No drainable fluid collection. No soft tissue gas. No free intraperitoneal fluid or air. Musculoskeletal: No acute fracture. IMPRESSION: 1. Similar right lateral abdominal  wall cellulitis. No drainable abscess. 2. No acute intra-abdominal abnormality. 3. Aortic Atherosclerosis (ICD10-I70.0). Electronically Signed   By: Minerva Fester M.D.   On: 03/04/2023 02:52    Procedures Procedures    Medications Ordered in ED Medications  fentaNYL (SUBLIMAZE) injection 50 mcg (has no administration in time range)  HYDROcodone-acetaminophen (NORCO/VICODIN) 5-325 MG per tablet 1 tablet (1 tablet Oral Given 03/04/23 0148)  iohexol (OMNIPAQUE) 350 MG/ML injection 75 mL (75 mLs Intravenous Contrast Given 03/04/23 0220)    ED Course/ Medical Decision Making/ A&P                                 Medical Decision Making Amount and/or Complexity of Data Reviewed Labs: ordered. Decision-making details documented in ED Course. Radiology: ordered and independent interpretation performed. Decision-making details documented in ED Course. ECG/medicine tests: ordered and independent interpretation performed. Decision-making details documented in ED Course.  Risk Prescription drug management. Decision  regarding hospitalization.   Abdominal wall cellulitis not improving with p.o. antibiotics.  Vitals remained stable.  She does not appear to be toxic or septic.  No appreciable drainable fluid collection on imaging. She is given IV fluids and IV antibiotics.  No significant leukocytosis.  Started IV vancomycin.  CT scan negative for drainable abscess.  Patient appears to be failing outpatient antibiotics and wound is agreeable to admission for IV antibiotics.  Discussed with Dr. Janalyn Shy.         Final Clinical Impression(s) / ED Diagnoses Final diagnoses:  Abdominal wall cellulitis    Rx / DC Orders ED Discharge Orders     None         Missael Ferrari, Jeannett Senior, MD 03/04/23 [phone]

## 2023-03-04 NOTE — Plan of Care (Signed)

## 2023-03-04 NOTE — ED Notes (Signed)
C-t will come and get the pt

## 2023-03-04 NOTE — Progress Notes (Signed)
PROGRESS NOTE                                                                                                                                                                                                             Patient Demographics:    Karla Watson, is a 73 y.o. female, DOB - [birth year], ZOX:[id number]  Outpatient Primary MD for the patient is Farris Has, MD    LOS - 1  Admit date - 03/03/2023    Chief Complaint  Patient presents with   Abscess       Brief Narrative (HPI from H&P)    73 y.o. female with medical history significant depression, nephrolithiasis, renal cyst, insomnia, GERD, hyperlipidemia and recurrent abdominal wall cellulitis and abscess presents emergency department complaining of right-sided lower abdominal wall pain, redness and swelling for last 4 to 5 days.  Patient has been seen PCP 2 days ago received oral doxycycline without much improvement of the abdominal wall infection.  Was concerned that her abdominal wound was getting more red and presented to the ER where CT scan showed cellulitis but no abscess, general surgery was consulted and hospitalist team was called to admit the patient.   Subjective:    [name] today has, No headache, No chest pain, No abdominal pain - No Nausea, No new weakness tingling or numbness, no SOB   Assessment  & Plan :   Cellulitis abdominal wall - History of recurrent cellulitis and abscess of the abdominal wall, recent MRSA positive.  -Patient had abdominal wall cellulitis and abscess underwent incision drainage in January 2025 by Dr. Angelena Form general surgery, completed a long course of antibiotic.    According to the patient minimal worsening in her abdominal wall redness for the last 2 days, received 2 doses of doxycycline by PCP and then presented to the ER.  On my exam she has signs of mild cellulitis possibly resolving cellulitis, recent  incision and drainage site appears stable, CT scan unremarkable except for cellulitis.   Has been seen by general surgery.  Case discussed with them, continue vancomycin, discharge in 1 to 2 days if stable on oral Zyvox, continue local wound care, she is afebrile, no leukocytosis, stable inflammatory markers.    Prerenal acute kidney injury Minimal dehydration, borderline AKI IV fluids and repeat BMP tomorrow.  Avoid nephrotoxins.  Chronic depression Generalized anxiety disorder -Continue BuSpar 300 mg daily   Insomnia -Continue trazodone at bedtime   Hyperlipidemia Continue Lipitor.   GERD - Continue Protonix.          Condition - Fair  Family Communication  :  None  Code Status :  Full  Consults  :  CCS  PUD Prophylaxis : PPI   Procedures  :     CT - 1. Similar right lateral abdominal wall cellulitis. No drainable abscess. 2. No acute intra-abdominal abnormality. 3. Aortic Atherosclerosis (ICD10-I70.0)      Disposition Plan  :    Status is: Observation  DVT Prophylaxis  :    enoxaparin (LOVENOX) injection 40 mg Start: 03/04/23 1000 SCDs Start: 03/04/23 0646 Place TED hose Start: 03/04/23 0646     Lab Results  Component Value Date   PLT 239 03/03/2023    Diet :  Diet Order             Diet Heart Room service appropriate? Yes; Fluid consistency: Thin  Diet effective now                    Inpatient Medications  Scheduled Meds:  atorvastatin  10 mg Oral Daily   buPROPion  300 mg Oral Daily   enoxaparin (LOVENOX) injection  40 mg Subcutaneous Q24H   FLUoxetine  40 mg Oral Daily   pantoprazole  40 mg Oral Daily   sodium chloride flush  3 mL Intravenous Q12H   traZODone  100 mg Oral QHS   Continuous Infusions:  sodium chloride     lactated ringers 75 mL/hr at 03/04/23 0722   PRN Meds:.sodium chloride, acetaminophen **OR** acetaminophen, ondansetron **OR** ondansetron (ZOFRAN) IV, sodium chloride flush, traMADol  Antibiotics  :     Anti-infectives (From admission, onward)    Start     Dose/Rate Route Frequency Ordered Stop   03/04/23 0630  vancomycin (VANCOREADY) IVPB 2000 mg/400 mL        2,000 mg 200 mL/hr over 120 Minutes Intravenous  Once 03/04/23 0611 03/04/23 0908         Objective:   Vitals:   03/03/23 2017 03/04/23 0414 03/04/23 1058  BP:  (!) 136/52 (!) 124/54  Pulse:  61 63  Resp:  15 15  Temp:  98.3 F (36.8 C) 98.8 F (37.1 C)  TempSrc:  Oral   SpO2:  98% 99%  Weight: 82.1 kg    Height: 5\' 3"  (1.6 m)      Wt Readings from Last 3 Encounters:  03/03/23 82.1 kg  01/29/23 82.1 kg  04/19/20 82.1 kg    No intake or output data in the 24 hours ending 03/04/23 1150   Physical Exam  Awake Alert, No new F.N deficits, Normal affect [place].AT,PERRAL Supple Neck, No JVD,   Symmetrical Chest wall movement, Good air movement bilaterally, CTAB RRR,No Gallops,Rubs or new Murmurs,  +ve B.Sounds, Abd Soft, No tenderness,   No Cyanosis, Clubbing or edema , Abd wall as below, no surrounding warmth or fluctuance        Data Review:    Recent Labs  Lab 03/03/23 2018  WBC 8.3  HGB 13.7  HCT 42.2  PLT 239  MCV 86.8  MCH 28.2  MCHC 32.5  RDW 12.6  LYMPHSABS 1.4  MONOABS 0.9  EOSABS 0.4  BASOSABS 0.0    Recent Labs  Lab 03/03/23 2018 03/04/23 0856  NA 138  --   K 4.1  --  CL 103  --   CO2 26  --   ANIONGAP 9  --   GLUCOSE 144*  --   BUN 13  --   CREATININE 1.17*  --   PROCALCITON  --  <0.10  CALCIUM 9.4  --       Recent Labs  Lab 03/03/23 2018 03/04/23 0856  PROCALCITON  --  <0.10  CALCIUM 9.4  --      Micro Results No results found for this or any previous visit (from the past 240 hours).  Radiology Report CT ABDOMEN PELVIS W CONTRAST Result Date: 03/04/2023 CLINICAL DATA:  Abdominal wall abscess. Abdominal surgery January 11th. Open area to the right abdominal wall. EXAM: CT ABDOMEN AND PELVIS WITH CONTRAST TECHNIQUE: Multidetector CT imaging of the abdomen  and pelvis was performed using the standard protocol following bolus administration of intravenous contrast. RADIATION DOSE REDUCTION: This exam was performed according to the departmental dose-optimization program which includes automated exposure control, adjustment of the mA and/or kV according to patient size and/or use of iterative reconstruction technique. CONTRAST:  75mL OMNIPAQUE IOHEXOL 350 MG/ML SOLN COMPARISON:  01/29/2023 FINDINGS: Lower chest: No acute abnormality. Hepatobiliary: No acute abnormality. Pancreas: Unremarkable. Spleen: Unremarkable. Adrenals/Urinary Tract: Stable adrenal glands. No urinary calculi or hydronephrosis. Unremarkable bladder. Stomach/Bowel: Normal caliber large and small bowel. Colonic diverticulosis without diverticulitis. Appendix is within normal limits. Small hiatal hernia. Vascular/Lymphatic: Aortic atherosclerosis. No enlarged abdominal or pelvic lymph nodes. Reproductive: Uterus and bilateral adnexa are unremarkable. Other: Loosely organized fluid and stranding in the right lateral abdominal wall is similar to prior. No drainable fluid collection. No soft tissue gas. No free intraperitoneal fluid or air. Musculoskeletal: No acute fracture. IMPRESSION: 1. Similar right lateral abdominal wall cellulitis. No drainable abscess. 2. No acute intra-abdominal abnormality. 3. Aortic Atherosclerosis (ICD10-I70.0). Electronically Signed   By: Minerva Fester M.D.   On: 03/04/2023 02:52     Signature  -   Susa Raring M.D on 03/04/2023 at 11:50 AM   -  To page go to [email or web address]

## 2023-03-04 NOTE — ED Notes (Signed)
Pt ambulated to the bathroom without assistance, is eating breakfast now.

## 2023-03-04 NOTE — ED Notes (Signed)
Abdominal abscess re-bandaged

## 2023-03-05 ENCOUNTER — Other Ambulatory Visit (HOSPITAL_COMMUNITY): Payer: Self-pay

## 2023-03-05 DIAGNOSIS — L03311 Cellulitis of abdominal wall: Secondary | ICD-10-CM | POA: Diagnosis not present

## 2023-03-05 LAB — COMPREHENSIVE METABOLIC PANEL
ALT: 14 U/L (ref 0–44)
AST: 13 U/L — ABNORMAL LOW (ref 15–41)
Albumin: 2.9 g/dL — ABNORMAL LOW (ref 3.5–5.0)
Alkaline Phosphatase: 47 U/L (ref 38–126)
Anion gap: 7 (ref 5–15)
BUN: 12 mg/dL (ref 8–23)
CO2: 25 mmol/L (ref 22–32)
Calcium: 8.8 mg/dL — ABNORMAL LOW (ref 8.9–10.3)
Chloride: 107 mmol/L (ref 98–111)
Creatinine, Ser: 0.99 mg/dL (ref 0.44–1.00)
GFR, Estimated: >60 mL/min (ref >60)
Glucose, Bld: 127 mg/dL — ABNORMAL HIGH (ref 70–99)
Potassium: 4.3 mmol/L (ref 3.5–5.1)
Sodium: 139 mmol/L (ref 135–145)
Total Bilirubin: 0.7 mg/dL (ref 0.0–1.2)
Total Protein: 5.8 g/dL — ABNORMAL LOW (ref 6.5–8.1)

## 2023-03-05 LAB — CBC WITH DIFFERENTIAL/PLATELET
Abs Immature Granulocytes: 0.01 10*3/uL (ref 0.00–0.07)
Basophils Absolute: 0 10*3/uL (ref 0.0–0.1)
Basophils Relative: 0 %
Eosinophils Absolute: 0.3 10*3/uL (ref 0.0–0.5)
Eosinophils Relative: 6 %
HCT: 36.5 % (ref 36.0–46.0)
Hemoglobin: 11.9 g/dL — ABNORMAL LOW (ref 12.0–15.0)
Immature Granulocytes: 0 %
Lymphocytes Relative: 19 %
Lymphs Abs: 1.1 10*3/uL (ref 0.7–4.0)
MCH: 27.9 pg (ref 26.0–34.0)
MCHC: 32.6 g/dL (ref 30.0–36.0)
MCV: 85.7 fL (ref 80.0–100.0)
Monocytes Absolute: 0.6 10*3/uL (ref 0.1–1.0)
Monocytes Relative: 11 %
Neutro Abs: 3.6 10*3/uL (ref 1.7–7.7)
Neutrophils Relative %: 64 %
Platelets: 203 10*3/uL (ref 150–400)
RBC: 4.26 MIL/uL (ref 3.87–5.11)
RDW: 12.6 % (ref 11.5–15.5)
WBC: 5.7 10*3/uL (ref 4.0–10.5)
nRBC: 0 % (ref 0.0–0.2)

## 2023-03-05 LAB — PROCALCITONIN: Procalcitonin: <0.1 ng/mL

## 2023-03-05 LAB — C-REACTIVE PROTEIN: CRP: 2.3 mg/dL — ABNORMAL HIGH (ref <1.0)

## 2023-03-05 LAB — MAGNESIUM: Magnesium: 1.7 mg/dL (ref 1.7–2.4)

## 2023-03-05 MED ORDER — TRAMADOL HCL 50 MG PO TABS: 50.0000 mg | ORAL_TABLET | Freq: Four times a day (QID) | ORAL | 0 refills | Status: AC | PRN

## 2023-03-05 MED ORDER — LINEZOLID 600 MG PO TABS
600.0000 mg | ORAL_TABLET | Freq: Two times a day (BID) | ORAL | 0 refills | Status: AC
  Filled 2023-03-05: qty 14, 7d supply, fill #0

## 2023-03-05 MED ORDER — BUPROPION HCL ER (XL) 150 MG PO TB24: 300.0000 mg | ORAL_TABLET | Freq: Every evening | ORAL | Status: DC

## 2023-03-05 MED ORDER — TRAMADOL HCL 50 MG PO TABS
50.0000 mg | ORAL_TABLET | Freq: Four times a day (QID) | ORAL | 0 refills | Status: DC | PRN
  Filled 2023-03-05: qty 30, 8d supply, fill #0

## 2023-03-05 MED ORDER — ACETAMINOPHEN 325 MG PO TABS: 650.0000 mg | ORAL_TABLET | Freq: Four times a day (QID) | ORAL | Status: AC | PRN

## 2023-03-05 MED ORDER — LINEZOLID 600 MG PO TABS
600.0000 mg | ORAL_TABLET | Freq: Two times a day (BID) | ORAL | Status: DC
  Administered 2023-03-05: 600 mg via ORAL
  Filled 2023-03-05: qty 1

## 2023-03-05 NOTE — Discharge Summary (Signed)
DISCHARGE SUMMARY  Karla Watson  MR#: [id number]  DOB:May 15, 1950  Date of Admission: 03/03/2023 Date of Discharge: 03/05/2023  Attending Physician:Mate Alegria Silvestre Gunner, MD  Patient's ACZ:[id number], Clifton Custard, MD  Disposition: D/C home   Follow-up Appts:  Follow-up Information     Farris Has, MD Follow up in 1 week(s).   Specialty: Family Medicine Contact information: [address] [place] [address] [phone]                 Tests Needing Follow-up: -assess abdominal wound   Discharge Diagnoses: Abdominal wall cellulitis Prerenal AKI Generalized anxiety disorder Insomnia HLD GERD  Initial presentation: 73 year old with a history of depression, nephrolithiasis, renal cysts, insomnia, GERD, HLD, and recurrent abdominal wall cellulitis with abscesses who presented to the ER 2/12 with complaints of right sided lower abdominal wall pain redness and swelling for 4-5 days. She was seen by her PCP 2 days prior and received oral doxycycline without improvement.   Hospital Course:  Abdominal wall cellulitis -history of recurrent cellulitis and abscess of the abdominal wall -recent MRSA positive Patient underwent incision and drainage 02/01/2023 by general surgery and completed a prolonged course of antibiotic -CT this admission unremarkable except for cellulitis -evaluated by general surgery with no need for surgical intervention at present -treated with vancomycin as an inpatient with transition to oral Zyvox for discharge home   Prerenal AKI Due to mild dehydration -creatinine has returned to normal with hydration   Generalized anxiety disorder Continue usual BuSpar dose   Insomnia Continue usual trazodone dose   HLD Continue Lipitor   GERD Continue Protonix  Allergies as of 03/05/2023       Reactions   Erythromycin Nausea And Vomiting        Medication List     TAKE these medications    acetaminophen 325 MG  tablet Commonly known as: TYLENOL Take 2 tablets (650 mg total) by mouth every 6 (six) hours as needed for mild pain (pain score 1-3) (or Fever >/= 101).   atorvastatin 10 MG tablet Commonly known as: LIPITOR Take 10 mg by mouth daily.   buPROPion 300 MG 24 hr tablet Commonly known as: WELLBUTRIN XL Take 300 mg by mouth at bedtime.   cholecalciferol 25 MCG (1000 UNIT) tablet Commonly known as: VITAMIN D3 Take 1,000 Units by mouth daily.   FLUoxetine 40 MG capsule Commonly known as: PROZAC Take 40 mg by mouth daily.   linezolid 600 MG tablet Commonly known as: ZYVOX Take 1 tablet (600 mg total) by mouth every 12 (twelve) hours for 7 days.   multivitamin tablet Take 1 tablet by mouth daily.   omeprazole 20 MG capsule Commonly known as: PRILOSEC Take 20 mg by mouth daily.   traMADol 50 MG tablet Commonly known as: ULTRAM Take 1 tablet (50 mg total) by mouth every 6 (six) hours as needed for moderate pain (pain score 4-6) or severe pain (pain score 7-10) (pain). What changed: reasons to take this   traZODone 100 MG tablet Commonly known as: DESYREL Take 100 mg by mouth at bedtime.        Day of Discharge BP (!) 141/62 (BP Location: Left Arm)   Pulse (!) 51   Temp 98.6 F (37 C)   Resp 17   Ht 5\' 3"  (1.6 m)   Wt 82.1 kg   LMP 01/20/2007   SpO2 98%   BMI 32.06 kg/m   Physical Exam: General: No acute respiratory distress Lungs: Clear to auscultation  bilaterally without wheezes or crackles Cardiovascular: Regular rate and rhythm without murmur gallop or rub normal S1 and S2 Abdomen: Nontender, nondistended, soft, bowel sounds positive, no rebound, no ascites, no appreciable mass -very superficial abdominal wound with resolved surrounding erythema and no purulent discharge with good granulation noted in base with no surrounding induration Extremities: No significant cyanosis, clubbing, or edema bilateral lower extremities  Basic Metabolic Panel: Recent Labs   Lab 03/03/23 2018 03/05/23 0620  NA 138 139  K 4.1 4.3  CL 103 107  CO2 26 25  GLUCOSE 144* 127*  BUN 13 12  CREATININE 1.17* 0.99  CALCIUM 9.4 8.8*  MG  --  1.7    CBC: Recent Labs  Lab 03/03/23 2018 03/05/23 0620  WBC 8.3 5.7  NEUTROABS 5.7 3.6  HGB 13.7 11.9*  HCT 42.2 36.5  MCV 86.8 85.7  PLT 239 203    Recent Results (from the past 240 hours)  Blood culture (routine x 2)     Status: None (Preliminary result)   Collection Time: 03/04/23  6:07 AM   Specimen: BLOOD RIGHT HAND  Result Value Ref Range Status   Specimen Description BLOOD RIGHT HAND  Final   Special Requests   Final    BOTTLES DRAWN AEROBIC AND ANAEROBIC Blood Culture results may not be optimal due to an inadequate volume of blood received in culture bottles   Culture   Final    NO GROWTH < 24 HOURS Performed at [facility], [address], [place], [place] [address]    Report Status PENDING  Incomplete  Blood culture (routine x 2)     Status: None (Preliminary result)   Collection Time: 03/04/23  6:12 AM   Specimen: BLOOD LEFT HAND  Result Value Ref Range Status   Specimen Description BLOOD LEFT HAND  Final   Special Requests   Final    BOTTLES DRAWN AEROBIC ONLY Blood Culture adequate volume   Culture   Final    NO GROWTH < 24 HOURS Performed at [facility], [address], [place], [place] [address]    Report Status PENDING  Incomplete      Time spent in discharge (includes decision making & examination of pt): 30 minutes  03/05/2023, 11:19 AM   Lonia Blood, MD [facility] Office  [phone]

## 2023-03-05 NOTE — Discharge Instructions (Signed)
Keep your abdominal wound covered with a dry dressing. Change it each day, or more frequently as needed if it becomes soiled. You may shower or bath as per normal, and cleaning of the wound with soap and water daily is also recommended.

## 2023-03-07 NOTE — Discharge Summary (Addendum)
 Physician Discharge Summary   Patient: Karla Watson MRN: [id number] DOB: Mar 04, 1950  Admit date:     01/29/2023  Discharge date: 02/04/2023  Discharge Physician: Fran Lowes   PCP: Farris Has, MD   Recommendations at discharge:    Discharge to home Follow up with general surgery in 3 weeks as directed. Daily dressing changes as directed by general surgery.  Follow up with PCP in 7-10 days.  Discharge Diagnoses: Principal Problem:   Cellulitis and abscess of trunk Active Problems:   Abdominal wall cellulitis   Depression   Class 1 obesity due to excess calories with body mass index (BMI) of 32.0 to 32.9 in adult   Moderate episode of recurrent major depressive disorder (HCC)  Resolved Problems:   Cellulitis  Hospital Course:  Assessment and Plan: This 73 years old female with PMH significant for depression, renal cysts, nephrolithiasis presented in the ED with abdominal wall wound for last 4 days. Patient has developed a small mark in the right side of her abdomen,  She thought it is a bug bite, it continued to get worse and got bigger.  She has seen her primary care physician who has started her on Augmentin but has not seen any improvement.  Later patient has developed fever,  worsening pain.  Patient came to the ED.  CT abdomen and pelvis showed right-sided lateral cellulitis, nonobstructing renal calculi , renal cyst.  Patient started on vancomycin and ceftriaxone and admitted for further evaluation.    Wound culture has grown out staph. It has grown out MRSA. It is sensitive to Clindamycin. She will be discharged to home on Clindamycin today.  Consultants: Wound care, general surgery Procedures performed: None  Disposition: Home Diet recommendation:  Discharge Diet Orders (From admission, onward)     Start     Ordered   02/04/23 0000  Diet - low sodium heart healthy        02/04/23 1145   02/04/23 0000  Diet Carb Modified        02/04/23 1145            Cardiac diet DISCHARGE MEDICATION: Allergies as of 02/04/2023       Reactions   Erythromycin Nausea And Vomiting        Medication List     STOP taking these medications    Bactrim DS 800-160 MG tablet Generic drug: sulfamethoxazole-trimethoprim       TAKE these medications    atorvastatin 10 MG tablet Commonly known as: LIPITOR Take 10 mg by mouth daily.   buPROPion 300 MG 24 hr tablet Commonly known as: WELLBUTRIN XL Take 300 mg by mouth at bedtime.   cholecalciferol 25 MCG (1000 UNIT) tablet Commonly known as: VITAMIN D3 Take 1,000 Units by mouth daily.   FLUoxetine 40 MG capsule Commonly known as: PROZAC Take 40 mg by mouth daily.   multivitamin tablet Take 1 tablet by mouth daily.   omeprazole 20 MG capsule Commonly known as: PRILOSEC Take 20 mg by mouth daily.   traZODone 100 MG tablet Commonly known as: DESYREL Take 100 mg by mouth at bedtime.       ASK your doctor about these medications    clindamycin 300 MG capsule Commonly known as: Cleocin Take 1 capsule (300 mg total) by mouth 3 (three) times daily for 7 days. Ask about: Should I take this medication?               Discharge Care Instructions  (From admission, onward)  Start     Ordered   02/04/23 0000  Discharge wound care:       Comments: Daily dressing changes with wet to dry dressins with 4x4 gauze. Spouse has been instructed on how to do dressing changes per Barnetta Chapel, PA-C   02/04/23 1145            Follow-up Information     Maczis, Hedda Slade, PA-C Follow up on 02/23/2023.   Specialty: General Surgery Why: 10:30am, Arrive 30 minutes prior to your appointment time, Please bring your insurance card and photo ID Contact information: [address] [facility] [place] [address] [facility] [place] [address] [phone]                Discharge Exam: Filed Weights   01/29/23 1739  Weight: 82.1 kg   Exam:  Constitutional:   The patient is awake, alert, and oriented x 3. No acute distress. Respiratory:  No increased work of breathing. No wheezes, rales, or rhonchi No tactile fremitus Cardiovascular:  Regular rate and rhythm No murmurs, ectopy, or gallups. No lateral PMI. No thrills. Abdomen:  Abdomen is soft, non-tender, non-distended No hernias, masses, or organomegaly Normoactive bowel sounds.  Musculoskeletal:  No cyanosis, clubbing, or edema Skin:  No rashes, lesions, ulcers palpation of skin: no induration or nodules Neurologic:  CN 2-12 intact Sensation all 4 extremities intact Psychiatric:  Mental status Mood, affect appropriate Orientation to person, place, time  judgment and insight appear intact   Condition at discharge: fair  The results of significant diagnostics from this hospitalization (including imaging, microbiology, ancillary and laboratory) are listed below for reference.   Imaging Studies: CT ABDOMEN PELVIS W CONTRAST Result Date: 03/04/2023 CLINICAL DATA:  Abdominal wall abscess. Abdominal surgery January 11th. Open area to the right abdominal wall. EXAM: CT ABDOMEN AND PELVIS WITH CONTRAST TECHNIQUE: Multidetector CT imaging of the abdomen and pelvis was performed using the standard protocol following bolus administration of intravenous contrast. RADIATION DOSE REDUCTION: This exam was performed according to the departmental dose-optimization program which includes automated exposure control, adjustment of the mA and/or kV according to patient size and/or use of iterative reconstruction technique. CONTRAST:  75mL OMNIPAQUE IOHEXOL 350 MG/ML SOLN COMPARISON:  01/29/2023 FINDINGS: Lower chest: No acute abnormality. Hepatobiliary: No acute abnormality. Pancreas: Unremarkable. Spleen: Unremarkable. Adrenals/Urinary Tract: Stable adrenal glands. No urinary calculi or hydronephrosis. Unremarkable bladder. Stomach/Bowel: Normal caliber large and small bowel. Colonic diverticulosis  without diverticulitis. Appendix is within normal limits. Small hiatal hernia. Vascular/Lymphatic: Aortic atherosclerosis. No enlarged abdominal or pelvic lymph nodes. Reproductive: Uterus and bilateral adnexa are unremarkable. Other: Loosely organized fluid and stranding in the right lateral abdominal wall is similar to prior. No drainable fluid collection. No soft tissue gas. No free intraperitoneal fluid or air. Musculoskeletal: No acute fracture. IMPRESSION: 1. Similar right lateral abdominal wall cellulitis. No drainable abscess. 2. No acute intra-abdominal abnormality. 3. Aortic Atherosclerosis (ICD10-I70.0). Electronically Signed   By: Minerva Fester M.D.   On: 03/04/2023 02:52    Microbiology: Results for orders placed or performed during the hospital encounter of 01/29/23  Culture, blood (routine x 2)     Status: None   Collection Time: 01/29/23  8:07 PM   Specimen: BLOOD  Result Value Ref Range Status   Specimen Description BLOOD LEFT ANTECUBITAL  Final   Special Requests   Final    BOTTLES DRAWN AEROBIC AND ANAEROBIC Blood Culture results may not be optimal due to an inadequate volume of blood received in culture  bottles   Culture   Final    NO GROWTH 5 DAYS Performed at [facility], [address] [place], [place] [address]    Report Status 02/03/2023 FINAL  Final  Culture, blood (routine x 2)     Status: None   Collection Time: 01/29/23  8:12 PM   Specimen: BLOOD  Result Value Ref Range Status   Specimen Description BLOOD BLOOD LEFT HAND  Final   Special Requests   Final    BOTTLES DRAWN AEROBIC AND ANAEROBIC Blood Culture adequate volume   Culture   Final    NO GROWTH 5 DAYS Performed at [facility], [address], [place], [place] [address]    Report Status 02/03/2023 FINAL  Final  Aerobic/Anaerobic Culture w Gram Stain (surgical/deep wound)     Status: None   Collection Time: 02/01/23  4:35 PM   Specimen: Path fluid; Body Fluid  Result Value Ref Range  Status   Specimen Description ABSCESS ABDOMEN  Final   Special Requests PT ON VANC ROCEPHIN  Final   Gram Stain   Final    FEW WBC PRESENT, PREDOMINANTLY PMN FEW GRAM POSITIVE COCCI IN PAIRS IN CLUSTERS    Culture   Final    FEW METHICILLIN RESISTANT STAPHYLOCOCCUS AUREUS NO ANAEROBES ISOLATED Performed at [facility], [address] [place], [place] [address]    Report Status 02/06/2023 FINAL  Final   Organism ID, Bacteria METHICILLIN RESISTANT STAPHYLOCOCCUS AUREUS  Final      Susceptibility   Methicillin resistant staphylococcus aureus - MIC*    CIPROFLOXACIN >=8 RESISTANT Resistant     ERYTHROMYCIN <=0.25 SENSITIVE Sensitive     GENTAMICIN 4 SENSITIVE Sensitive     OXACILLIN >=4 RESISTANT Resistant     TETRACYCLINE <=1 SENSITIVE Sensitive     VANCOMYCIN 1 SENSITIVE Sensitive     TRIMETH/SULFA <=10 SENSITIVE Sensitive     CLINDAMYCIN <=0.25 SENSITIVE Sensitive     RIFAMPIN <=0.5 SENSITIVE Sensitive     Inducible Clindamycin NEGATIVE Sensitive     LINEZOLID 2 SENSITIVE Sensitive     * FEW METHICILLIN RESISTANT STAPHYLOCOCCUS AUREUS    Labs: CBC: Recent Labs  Lab 03/03/23 2018 03/05/23 0620  WBC 8.3 5.7  NEUTROABS 5.7 3.6  HGB 13.7 11.9*  HCT 42.2 36.5  MCV 86.8 85.7  PLT 239 203   Basic Metabolic Panel: Recent Labs  Lab 03/03/23 2018 03/05/23 0620  NA 138 139  K 4.1 4.3  CL 103 107  CO2 26 25  GLUCOSE 144* 127*  BUN 13 12  CREATININE 1.17* 0.99  CALCIUM 9.4 8.8*  MG  --  1.7   Liver Function Tests: Recent Labs  Lab 03/05/23 0620  AST 13*  ALT 14  ALKPHOS 47  BILITOT 0.7  PROT 5.8*  ALBUMIN 2.9*   CBG: No results for input(s): "GLUCAP" in the last 168 hours.  Assessment & Plan:   Principal Problem:   Cellulitis and abscess of trunk Active Problems:   Depression with anxiety   Class 1 obesity due to excess calories with body mass index (BMI) of 32.0 to 32.9 in adult   Moderate episode of recurrent major depressive disorder (HCC)    Abdominal wall cellulitis/abscess: - As a compllication of uncontrolled diabetes. Patient presented with right-sided abdominal wall pain,  redness,  tenderness and swelling. There is no leukocytosis, remains afebrile. Patient was prescribed Augmentin without any improvement as an outpatient. CT no abscess but today is draining  frank pus, does appear to have organized into an abscess - gen surg consult for I and D - switch abx from ceftriaxone to vancomycin (infiltrated later in the day, Hylenex given) - Also has a small cellulitic tender nodule on her anterior mons that we will need to keep an eye on, for the time being think should resolve with abx --Culture of wound has grown out staph. Sensitivities are pending.    Depression: Continue bupropion and Prozac.   Insomnia: Continue trazodone.   GERD: Continue pantoprazole.   Hyperlipidemia: Continue Lipitor   DVT prophylaxis: Lovenox Code Status: Full code Family Communication: No family at bedside. Disposition Plan:     Status is: Inpatient Remains inpatient appropriate because: ongoing need for IV abx   Discharge time spent: greater than 30 minutes.  Signed: Elbia Paro, DO [facility] 03/07/2023

## 2023-03-09 LAB — CULTURE, BLOOD (ROUTINE X 2)
Culture: NO GROWTH
Culture: NO GROWTH
Special Requests: ADEQUATE

## 2023-04-15 DIAGNOSIS — R030 Elevated blood-pressure reading, without diagnosis of hypertension: Secondary | ICD-10-CM | POA: Diagnosis not present

## 2023-04-15 DIAGNOSIS — L0390 Cellulitis, unspecified: Secondary | ICD-10-CM | POA: Diagnosis not present

## 2023-04-19 DIAGNOSIS — L0390 Cellulitis, unspecified: Secondary | ICD-10-CM | POA: Diagnosis not present

## 2023-05-10 DIAGNOSIS — H524 Presbyopia: Secondary | ICD-10-CM | POA: Diagnosis not present

## 2023-05-10 DIAGNOSIS — H52223 Regular astigmatism, bilateral: Secondary | ICD-10-CM | POA: Diagnosis not present

## 2023-05-10 DIAGNOSIS — E119 Type 2 diabetes mellitus without complications: Secondary | ICD-10-CM | POA: Diagnosis not present

## 2023-05-24 DIAGNOSIS — E538 Deficiency of other specified B group vitamins: Secondary | ICD-10-CM | POA: Diagnosis not present

## 2023-05-24 DIAGNOSIS — E119 Type 2 diabetes mellitus without complications: Secondary | ICD-10-CM | POA: Diagnosis not present

## 2023-05-24 DIAGNOSIS — E559 Vitamin D deficiency, unspecified: Secondary | ICD-10-CM | POA: Diagnosis not present

## 2023-05-24 DIAGNOSIS — R5383 Other fatigue: Secondary | ICD-10-CM | POA: Diagnosis not present

## 2023-07-12 DIAGNOSIS — R7989 Other specified abnormal findings of blood chemistry: Secondary | ICD-10-CM | POA: Diagnosis not present

## 2023-07-12 DIAGNOSIS — E559 Vitamin D deficiency, unspecified: Secondary | ICD-10-CM | POA: Diagnosis not present

## 2023-07-12 DIAGNOSIS — G4700 Insomnia, unspecified: Secondary | ICD-10-CM | POA: Diagnosis not present

## 2023-07-12 DIAGNOSIS — E119 Type 2 diabetes mellitus without complications: Secondary | ICD-10-CM | POA: Diagnosis not present

## 2023-07-12 DIAGNOSIS — F329 Major depressive disorder, single episode, unspecified: Secondary | ICD-10-CM | POA: Diagnosis not present

## 2023-07-12 DIAGNOSIS — K219 Gastro-esophageal reflux disease without esophagitis: Secondary | ICD-10-CM | POA: Diagnosis not present

## 2023-07-12 DIAGNOSIS — E785 Hyperlipidemia, unspecified: Secondary | ICD-10-CM | POA: Diagnosis not present

## 2023-08-16 DIAGNOSIS — J340 Abscess, furuncle and carbuncle of nose: Secondary | ICD-10-CM | POA: Diagnosis not present

## 2023-09-14 DIAGNOSIS — R7989 Other specified abnormal findings of blood chemistry: Secondary | ICD-10-CM | POA: Diagnosis not present

## 2023-09-14 DIAGNOSIS — E119 Type 2 diabetes mellitus without complications: Secondary | ICD-10-CM | POA: Diagnosis not present

## 2023-09-14 DIAGNOSIS — E559 Vitamin D deficiency, unspecified: Secondary | ICD-10-CM | POA: Diagnosis not present
